# Patient Record
Sex: Female | Born: 1955
Health system: Southern US, Community
[De-identification: ages and names within clinical notes are randomized; demographics above are authoritative.]

## PROBLEM LIST (undated history)

## (undated) DIAGNOSIS — I10 Essential (primary) hypertension: Secondary | ICD-10-CM

## (undated) DIAGNOSIS — E559 Vitamin D deficiency, unspecified: Secondary | ICD-10-CM

## (undated) DIAGNOSIS — M8580 Other specified disorders of bone density and structure, unspecified site: Secondary | ICD-10-CM

## (undated) DIAGNOSIS — R42 Dizziness and giddiness: Secondary | ICD-10-CM

## (undated) DIAGNOSIS — E785 Hyperlipidemia, unspecified: Secondary | ICD-10-CM

## (undated) DIAGNOSIS — N809 Endometriosis, unspecified: Secondary | ICD-10-CM

## (undated) HISTORY — PX: BREAST EXCISIONAL BIOPSY: SUR124

## (undated) HISTORY — DX: Other specified disorders of bone density and structure, unspecified site: M85.80

## (undated) HISTORY — DX: Vitamin D deficiency, unspecified: E55.9

## (undated) HISTORY — DX: Hyperlipidemia, unspecified: E78.5

## (undated) HISTORY — PX: DIAGNOSTIC LAPAROSCOPY: SUR761

## (undated) HISTORY — PX: VAGINAL HYSTERECTOMY: SUR661

## (undated) HISTORY — PX: TUBAL LIGATION: SHX77

## (undated) HISTORY — PX: BREAST BIOPSY: SHX20

## (undated) HISTORY — PX: COLONOSCOPY: SHX174

## (undated) HISTORY — DX: Endometriosis, unspecified: N80.9

---

## 2004-01-13 ENCOUNTER — Ambulatory Visit: Payer: Self-pay

## 2005-02-09 ENCOUNTER — Ambulatory Visit: Payer: Self-pay

## 2006-02-16 ENCOUNTER — Ambulatory Visit: Payer: Self-pay

## 2006-02-21 ENCOUNTER — Ambulatory Visit: Payer: Self-pay

## 2006-08-23 ENCOUNTER — Ambulatory Visit: Payer: Self-pay

## 2007-03-14 ENCOUNTER — Ambulatory Visit: Payer: Self-pay

## 2008-04-09 ENCOUNTER — Ambulatory Visit: Payer: Self-pay

## 2009-04-30 ENCOUNTER — Ambulatory Visit: Payer: Self-pay

## 2009-12-10 ENCOUNTER — Ambulatory Visit: Payer: Self-pay | Admitting: Unknown Physician Specialty

## 2010-06-02 ENCOUNTER — Ambulatory Visit: Payer: Self-pay

## 2011-06-07 ENCOUNTER — Ambulatory Visit: Payer: Self-pay

## 2012-07-24 ENCOUNTER — Ambulatory Visit: Payer: Self-pay

## 2013-07-19 LAB — HM PAP SMEAR: HM Pap smear: NEGATIVE

## 2013-10-02 ENCOUNTER — Ambulatory Visit: Payer: Self-pay

## 2014-11-05 LAB — HM MAMMOGRAPHY

## 2015-01-25 HISTORY — PX: COLONOSCOPY: SHX174

## 2015-11-11 ENCOUNTER — Other Ambulatory Visit: Payer: Self-pay | Admitting: Obstetrics and Gynecology

## 2016-01-13 ENCOUNTER — Other Ambulatory Visit: Payer: Self-pay | Admitting: Obstetrics and Gynecology

## 2016-01-13 DIAGNOSIS — Z1231 Encounter for screening mammogram for malignant neoplasm of breast: Secondary | ICD-10-CM

## 2016-02-17 ENCOUNTER — Ambulatory Visit
Admission: RE | Admit: 2016-02-17 | Discharge: 2016-02-17 | Disposition: A | Discharge status: Home / Self Care | Payer: 59 | Source: Ambulatory Visit | Attending: Obstetrics and Gynecology | Admitting: Obstetrics and Gynecology

## 2016-02-17 ENCOUNTER — Encounter: Payer: Self-pay | Admitting: Radiology

## 2016-02-17 DIAGNOSIS — Z1231 Encounter for screening mammogram for malignant neoplasm of breast: Secondary | ICD-10-CM | POA: Insufficient documentation

## 2016-02-23 ENCOUNTER — Other Ambulatory Visit: Payer: Self-pay | Admitting: Obstetrics and Gynecology

## 2016-02-23 DIAGNOSIS — R928 Other abnormal and inconclusive findings on diagnostic imaging of breast: Secondary | ICD-10-CM

## 2016-02-23 DIAGNOSIS — N6489 Other specified disorders of breast: Secondary | ICD-10-CM

## 2016-02-25 ENCOUNTER — Ambulatory Visit
Admission: RE | Admit: 2016-02-25 | Discharge: 2016-02-25 | Disposition: A | Discharge status: Home / Self Care | Payer: 59 | Source: Ambulatory Visit | Attending: Obstetrics and Gynecology | Admitting: Obstetrics and Gynecology

## 2016-02-25 DIAGNOSIS — R928 Other abnormal and inconclusive findings on diagnostic imaging of breast: Secondary | ICD-10-CM

## 2016-02-25 DIAGNOSIS — N6489 Other specified disorders of breast: Secondary | ICD-10-CM

## 2016-03-07 ENCOUNTER — Other Ambulatory Visit: Payer: 59

## 2016-03-07 ENCOUNTER — Ambulatory Visit: Payer: 59

## 2017-02-02 ENCOUNTER — Ambulatory Visit (INDEPENDENT_AMBULATORY_CARE_PROVIDER_SITE_OTHER): Payer: 59 | Admitting: Obstetrics and Gynecology

## 2017-02-02 ENCOUNTER — Encounter: Payer: Self-pay | Admitting: Obstetrics and Gynecology

## 2017-02-02 VITALS — BP 120/80 | HR 69 | Ht 64.0 in | Wt 160.0 lb

## 2017-02-02 DIAGNOSIS — Z1231 Encounter for screening mammogram for malignant neoplasm of breast: Secondary | ICD-10-CM | POA: Diagnosis not present

## 2017-02-02 DIAGNOSIS — Z01419 Encounter for gynecological examination (general) (routine) without abnormal findings: Secondary | ICD-10-CM | POA: Diagnosis not present

## 2017-02-02 DIAGNOSIS — Z1239 Encounter for other screening for malignant neoplasm of breast: Secondary | ICD-10-CM

## 2017-02-02 DIAGNOSIS — Z1151 Encounter for screening for human papillomavirus (HPV): Secondary | ICD-10-CM | POA: Diagnosis not present

## 2017-02-02 DIAGNOSIS — Z124 Encounter for screening for malignant neoplasm of cervix: Secondary | ICD-10-CM | POA: Diagnosis not present

## 2017-02-02 NOTE — Progress Notes (Signed)
PCP: Colleen Stephenson, No Pcp Per   Chief Complaint  Colleen Stephenson presents with  . Gynecologic Exam    HPI:      [name] is a 62 y.o. [id number] who LMP was No LMP recorded. Colleen Stephenson has had a hysterectomy., presents today for her annual examination.  Her menses are absent due to menopause. She does not have intermenstrual bleeding.  She does not have vasomotor sx.  Sex activity: single partner, contraception - post menopausal status. She does not have vaginal dryness.  Last Pap: July 18, 2013  Results were: no abnormalities /neg HPV DNA.  Hx of STDs: none  Last mammogram: February 25, 2016  Results were: normal--routine follow-up in 12 months There is no FH of breast cancer. There is no FH of ovarian cancer. The Colleen Stephenson does do self-breast exams.  Colonoscopy: colonoscopy 2 years ago with abnormalities. . Repeat due after 5 years.   Tobacco use: The Colleen Stephenson denies current or previous tobacco use. Alcohol use: social drinker Exercise: moderately active  She does get adequate calcium and Vitamin D in her diet.  Declines fasting labs--"Not going to make any changes anyway"  Past Medical History:  Diagnosis Date  . Endometriosis   . Hyperlipidemia   . Osteopenia   . Vitamin D deficiency     Past Surgical History:  Procedure Laterality Date  . BREAST BIOPSY Right    excisional late 1990's  . COLONOSCOPY    . DIAGNOSTIC LAPAROSCOPY    . TUBAL LIGATION    . VAGINAL HYSTERECTOMY      Family History  Problem Relation Age of Onset  . Stomach cancer Father   . Breast cancer Neg Hx   . Bone cancer Neg Hx   . Hypertension Neg Hx     Social History   Socioeconomic History  . Marital status: Single    Spouse name: Not on file  . Number of children: Not on file  . Years of education: Not on file  . Highest education level: Not on file  Social Needs  . Financial resource strain: Not on file  . Food insecurity - worry: Not on file  . Food insecurity - inability: Not on  file  . Transportation needs - medical: Not on file  . Transportation needs - non-medical: Not on file  Occupational History  . Not on file  Tobacco Use  . Smoking status: Never Smoker  . Smokeless tobacco: Never Used  Substance and Sexual Activity  . Alcohol use: Yes  . Drug use: No  . Sexual activity: Yes  Other Topics Concern  . Not on file  Social History Narrative  . Not on file    No outpatient medications have been marked as taking for the 02/02/17 encounter (Office Visit) with Alanmichael Barmore, Deirdre Evener, PA-C.      ROS:  Review of Systems  Constitutional: Negative for fatigue, fever and unexpected weight change.  Respiratory: Negative for cough, shortness of breath and wheezing.   Cardiovascular: Negative for chest pain, palpitations and leg swelling.  Gastrointestinal: Negative for blood in stool, constipation, diarrhea, nausea and vomiting.  Endocrine: Negative for cold intolerance, heat intolerance and polyuria.  Genitourinary: Negative for dyspareunia, dysuria, flank pain, frequency, genital sores, hematuria, menstrual problem, pelvic pain, urgency, vaginal bleeding, vaginal discharge and vaginal pain.  Musculoskeletal: Negative for back pain, joint swelling and myalgias.  Skin: Negative for rash.  Neurological: Negative for dizziness, syncope, light-headedness, numbness and headaches.  Hematological: Negative for adenopathy.  Psychiatric/Behavioral:  Negative for agitation, confusion, sleep disturbance and suicidal ideas. The Colleen Stephenson is not nervous/anxious.      Objective: BP 120/80   Pulse 69   Ht 5\' 4"  (1.626 m)   Wt 160 lb (72.6 kg)   BMI 27.46 kg/m    Physical Exam  Constitutional: She is oriented to person, place, and time. She appears well-developed and well-nourished.  Genitourinary: Vagina normal and uterus normal. There is no rash or tenderness on the right labia. There is no rash or tenderness on the left labia. No erythema or tenderness in the vagina.  No vaginal discharge found. Right adnexum does not display mass and does not display tenderness. Left adnexum does not display mass and does not display tenderness. Cervix does not exhibit motion tenderness or polyp. Uterus is not enlarged or tender.  Neck: Normal range of motion. No thyromegaly present.  Cardiovascular: Normal rate, regular rhythm and normal heart sounds.  No murmur heard. Pulmonary/Chest: Effort normal and breath sounds normal. Right breast exhibits no mass, no nipple discharge, no skin change and no tenderness. Left breast exhibits no mass, no nipple discharge, no skin change and no tenderness.  Abdominal: Soft. There is no tenderness. There is no guarding.  Musculoskeletal: Normal range of motion.  Neurological: She is alert and oriented to person, place, and time. No cranial nerve deficit.  Psychiatric: She has a normal mood and affect. Her behavior is normal.  Vitals reviewed.   Assessment/Plan:  Encounter for annual routine gynecological examination  Cervical cancer screening - Plan: IGP, Aptima HPV  Screening for HPV (human papillomavirus) - Plan: IGP, Aptima HPV  Screening for breast cancer - Pt to sched mammo. - Plan: MM DIGITAL SCREENING BILATERAL         GYN counsel mammography screening, adequate intake of calcium and vitamin D, diet and exercise    F/U  Return in about 1 year (around 02/02/2018).  Ercia Crisafulli B. Ernest Orr, PA-C 02/02/2017 8:28 AM

## 2017-02-02 NOTE — Patient Instructions (Signed)
I value your feedback and entrusting us with your care. If you get a [facility] patient survey, I would appreciate you taking the time to let us know about your experience today. Thank you! 

## 2017-02-04 LAB — IGP, APTIMA HPV
HPV Aptima: NEGATIVE
PAP SMEAR COMMENT: 0

## 2017-03-15 ENCOUNTER — Encounter: Payer: Self-pay | Admitting: Obstetrics and Gynecology

## 2017-03-15 ENCOUNTER — Ambulatory Visit
Admission: RE | Admit: 2017-03-15 | Discharge: 2017-03-15 | Disposition: A | Discharge status: Home / Self Care | Payer: 59 | Source: Ambulatory Visit | Attending: Obstetrics and Gynecology | Admitting: Obstetrics and Gynecology

## 2017-03-15 DIAGNOSIS — Z1231 Encounter for screening mammogram for malignant neoplasm of breast: Secondary | ICD-10-CM | POA: Diagnosis present

## 2017-03-15 DIAGNOSIS — Z1239 Encounter for other screening for malignant neoplasm of breast: Secondary | ICD-10-CM

## 2018-01-28 IMAGING — MG MM DIGITAL DIAGNOSTIC UNILAT*R* W/ TOMO W/ CAD
6 series · 6 of 14 positions shown · non-contrast
Comparison: Previous exam(s).

CLINICAL DATA: Callback from screening mammogram for possible
asymmetry

EXAM:
2D DIGITAL DIAGNOSTIC UNILATERAL RIGHT MAMMOGRAM WITH CAD AND
ADJUNCT TOMO

[R MLO]
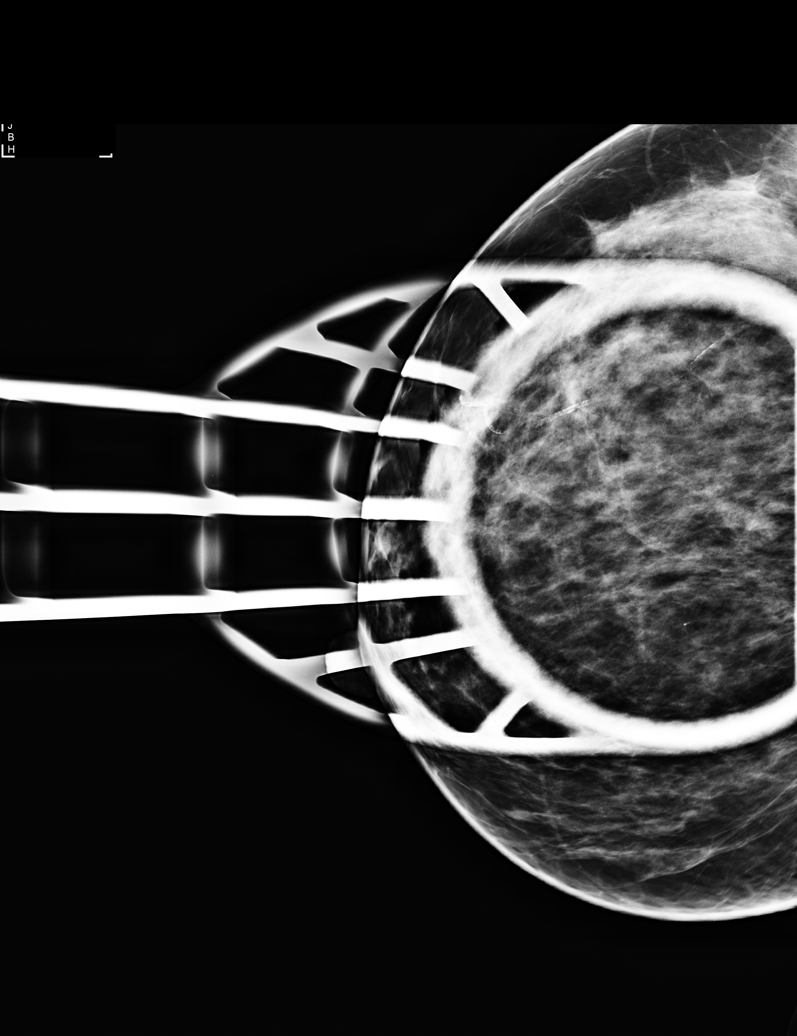

[R CC synth-2D]
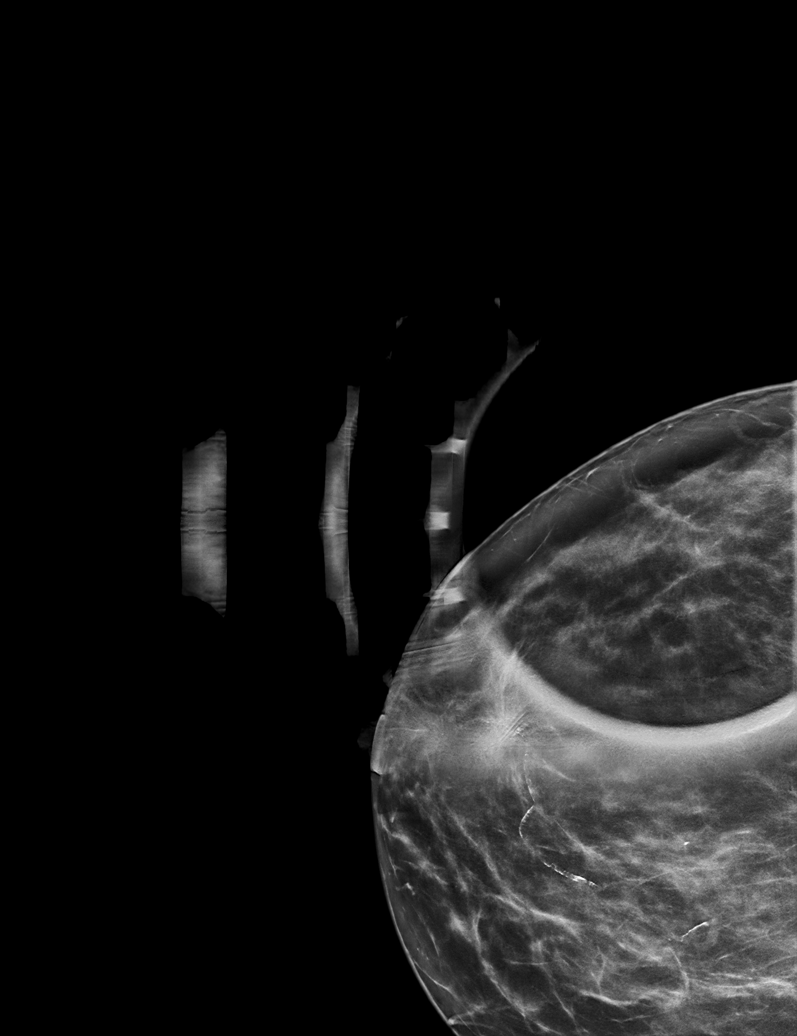

[R MLO synth-2D]
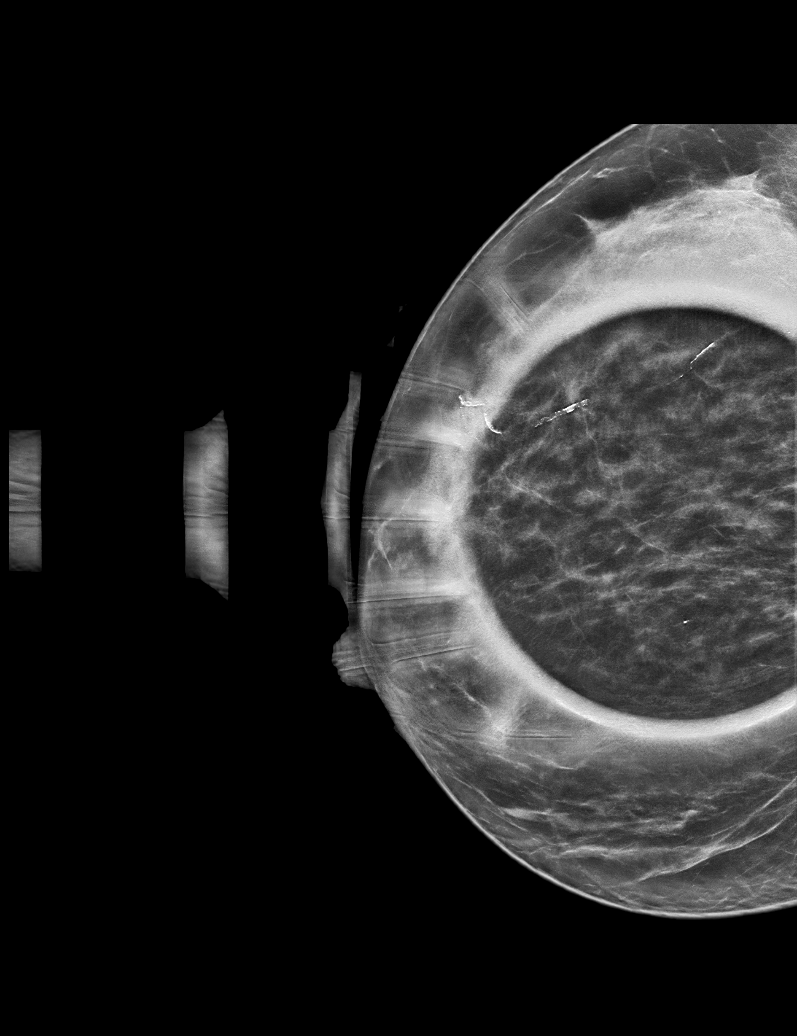

[R CC]
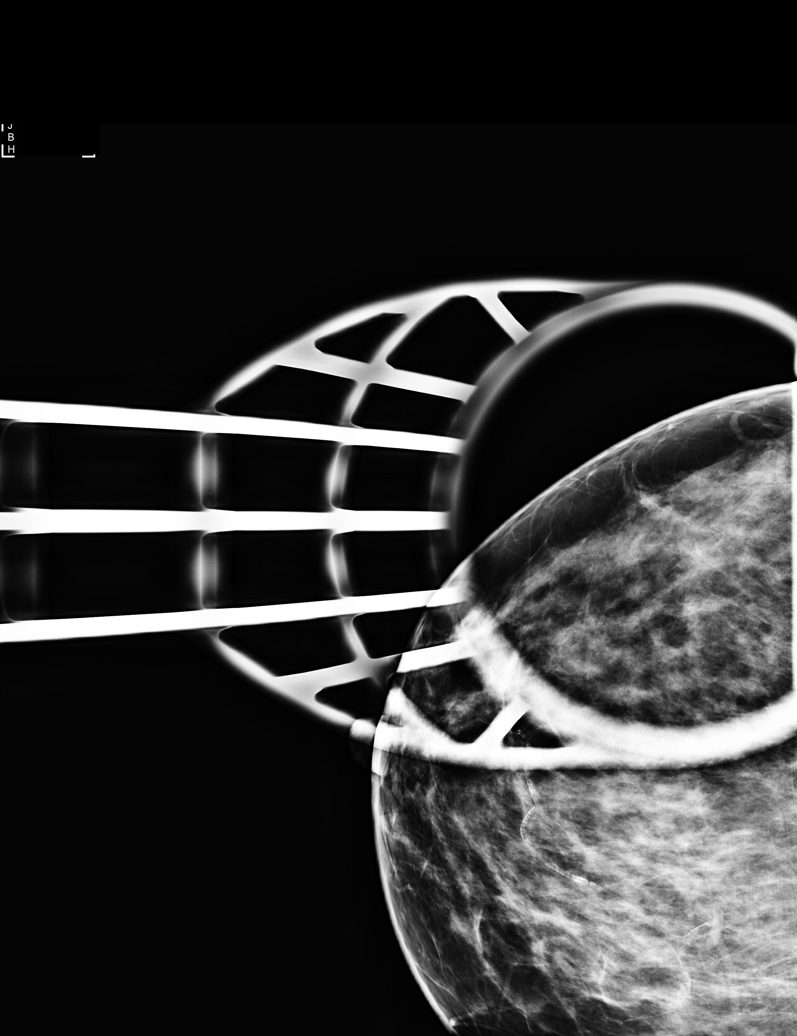

[R CC tomo · tomo slice 35/68.0]
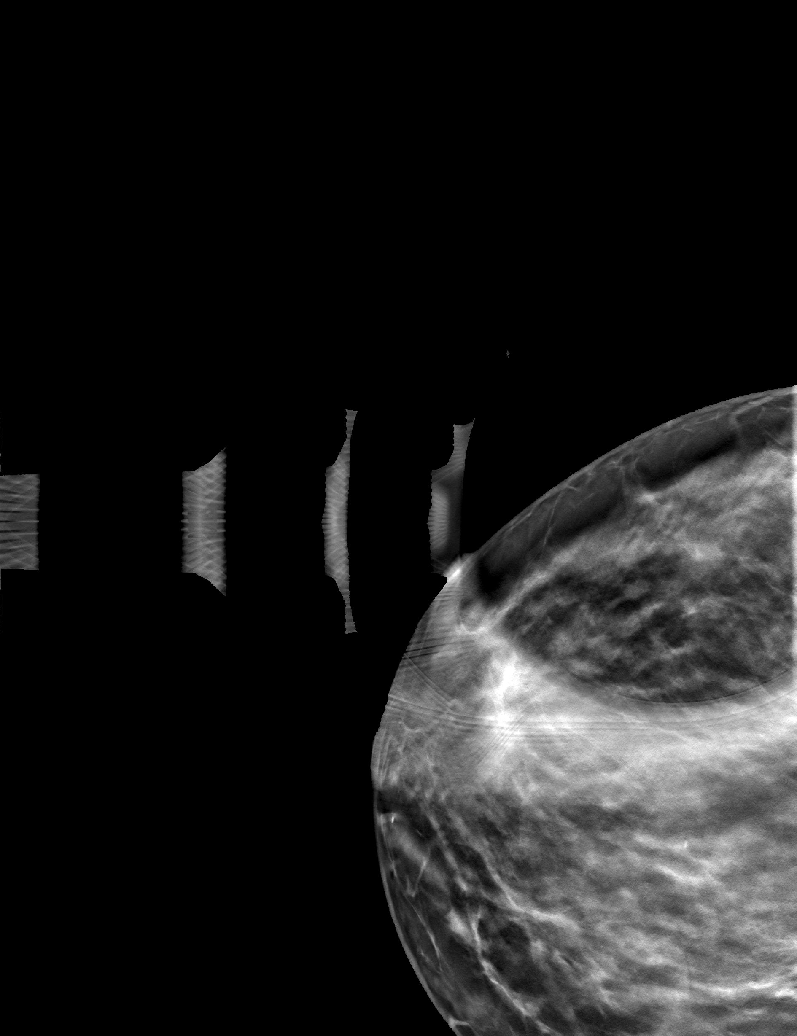

[R MLO tomo · tomo slice 34/67.0]
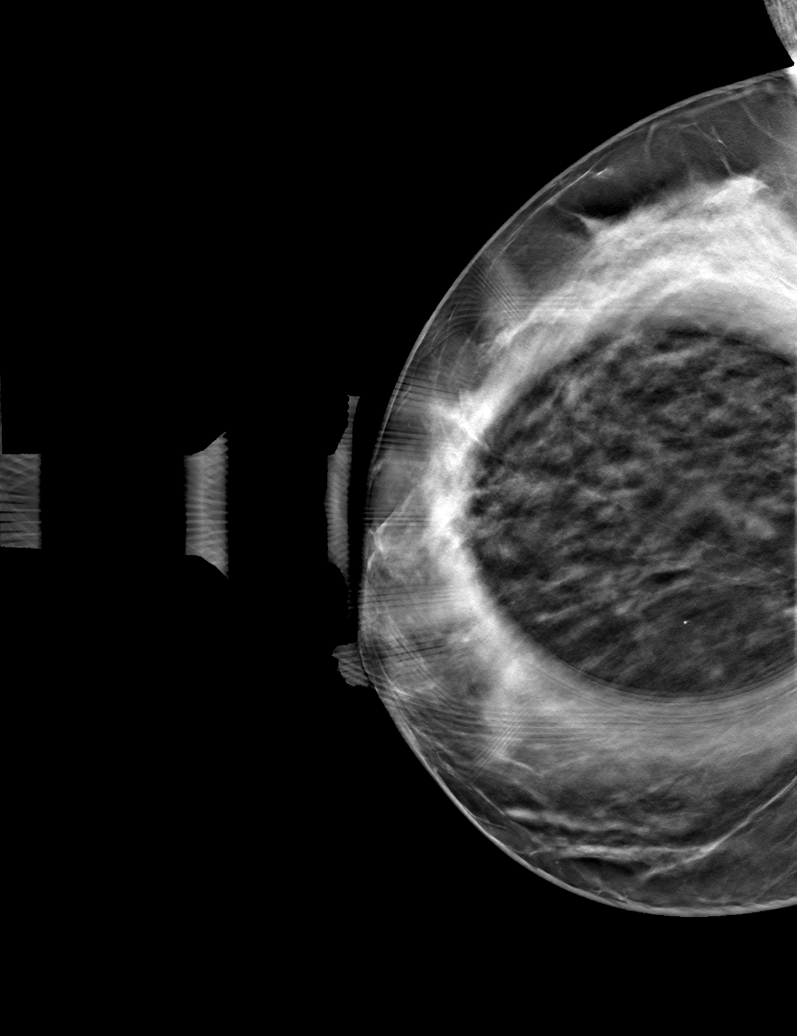

[6 of 14 positions shown; findings below may reference images not displayed]

ACR Breast Density Category c: The breast tissue is heterogeneously
dense, which may obscure small masses.
FINDINGS: Cc and MLO spot-compression views of the area of concern in the
right breast were obtained with tomosynthesis. No persistent
abnormality is identified in the area of concern. The breast
parenchymal pattern appears similar to multiple prior mammograms
dating back to at least 5696.

Mammographic images were processed with CAD.
IMPRESSION: No mammographic evidence of malignancy.

RECOMMENDATION:
Screening mammogram in one year.(Code:[id number]-[id number])

I have discussed the findings and recommendations with the patient.
Results were also provided in writing at the conclusion of the
visit. If applicable, a reminder letter will be sent to the patient
regarding the next appointment.

BI-RADS CATEGORY  1: Negative.

## 2018-04-23 ENCOUNTER — Other Ambulatory Visit: Payer: Self-pay | Admitting: Obstetrics and Gynecology

## 2018-05-22 ENCOUNTER — Ambulatory Visit: Payer: 59 | Admitting: Obstetrics and Gynecology

## 2018-07-11 NOTE — Patient Instructions (Addendum)
I value your feedback and entrusting us with your care. If you get a [place] patient survey, I would appreciate you taking the time to let us know about your experience today. Thank you!  [facility]: [phone]    

## 2018-07-11 NOTE — Progress Notes (Addendum)
PCP: Patient, No Pcp Per   Chief Complaint  Patient presents with  . Gynecologic Exam    HPI:      [name] is a 63 y.o. [id number] who LMP was No LMP recorded. Patient has had a hysterectomy., presents today for her annual examination.  Her menses are absent due to menopause. She does not have intermenstrual bleeding. She does not have vasomotor sx.   Sex activity: single partner, contraception - post menopausal status. She does not have vaginal dryness.  Last Pap: 02/02/17  Results were: no abnormalities /neg HPV DNA.  Hx of STDs: none  Last mammogram: 03/15/17  Results were: normal--routine follow-up in 12 months There is a FH of breast cancer in her pat cousin, genetic testing not indicated for pt. There is no FH of ovarian cancer. The patient does do occas self-breast exams.  Colonoscopy: colonoscopy 3 years ago with abnormalities. . Repeat due after 5 years.   Tobacco use: The patient denies current or previous tobacco use. Alcohol use: social drinker Exercise: moderately active  She does get adequate calcium and Vitamin D in her diet.  Declines fasting labs.  Past Medical History:  Diagnosis Date  . Endometriosis   . Hyperlipidemia   . Osteopenia   . Vitamin D deficiency     Past Surgical History:  Procedure Laterality Date  . BREAST BIOPSY Right    excisional late 1990's  . COLONOSCOPY    . DIAGNOSTIC LAPAROSCOPY    . TUBAL LIGATION    . VAGINAL HYSTERECTOMY      Family History  Problem Relation Age of Onset  . Stomach cancer Father   . Breast cancer Cousin   . Bone cancer Neg Hx   . Hypertension Neg Hx     Social History   Socioeconomic History  . Marital status: Single    Spouse name: Not on file  . Number of children: Not on file  . Years of education: Not on file  . Highest education level: Not on file  Occupational History  . Not on file  Social Needs  . Financial resource strain: Not on file  . Food insecurity    Worry: Not  on file    Inability: Not on file  . Transportation needs    Medical: Not on file    Non-medical: Not on file  Tobacco Use  . Smoking status: Never Smoker  . Smokeless tobacco: Never Used  Substance and Sexual Activity  . Alcohol use: Yes  . Drug use: No  . Sexual activity: Yes    Birth control/protection: Surgical    Comment: Hysterectomy  Lifestyle  . Physical activity    Days per week: Not on file    Minutes per session: Not on file  . Stress: Not on file  Relationships  . Social Herbalist on phone: Not on file    Gets together: Not on file    Attends religious service: Not on file    Active member of club or organization: Not on file    Attends meetings of clubs or organizations: Not on file    Relationship status: Not on file  . Intimate partner violence    Fear of current or ex partner: Not on file    Emotionally abused: Not on file    Physically abused: Not on file    Forced sexual activity: Not on file  Other Topics Concern  . Not on file  Social History  Narrative  . Not on file    No outpatient medications have been marked as taking for the 07/12/18 encounter (Office Visit) with Calise Dunckel, Deirdre Evener, PA-C.      ROS:  Review of Systems  Constitutional: Negative for fatigue, fever and unexpected weight change.  Respiratory: Negative for cough, shortness of breath and wheezing.   Cardiovascular: Negative for chest pain, palpitations and leg swelling.  Gastrointestinal: Negative for blood in stool, constipation, diarrhea, nausea and vomiting.  Endocrine: Negative for cold intolerance, heat intolerance and polyuria.  Genitourinary: Negative for dyspareunia, dysuria, flank pain, frequency, genital sores, hematuria, menstrual problem, pelvic pain, urgency, vaginal bleeding, vaginal discharge and vaginal pain.  Musculoskeletal: Negative for back pain, joint swelling and myalgias.  Skin: Negative for rash.  Neurological: Negative for dizziness, syncope,  light-headedness, numbness and headaches.  Hematological: Negative for adenopathy.  Psychiatric/Behavioral: Negative for agitation, confusion, sleep disturbance and suicidal ideas. The patient is not nervous/anxious.      Objective: BP 112/82   Ht 5\' 4"  (1.626 m)   Wt 159 lb 9.6 oz (72.4 kg)   BMI 27.40 kg/m    Physical Exam Constitutional:      Appearance: She is well-developed.  Genitourinary:     Vulva, vagina, right adnexa and left adnexa normal.     No vaginal discharge, erythema or tenderness.     Cervix is absent.     Uterus is absent.     No right or left adnexal mass present.     Right adnexa not tender.     Left adnexa not tender.     Genitourinary Comments: UTERUS/CX SURG REM  Neck:     Musculoskeletal: Normal range of motion.     Thyroid: No thyromegaly.  Cardiovascular:     Rate and Rhythm: Normal rate and regular rhythm.     Heart sounds: Normal heart sounds. No murmur.  Pulmonary:     Effort: Pulmonary effort is normal.     Breath sounds: Normal breath sounds.  Chest:     Breasts:        Right: No mass, nipple discharge, skin change or tenderness.        Left: No mass, nipple discharge, skin change or tenderness.  Abdominal:     Palpations: Abdomen is soft.     Tenderness: There is no abdominal tenderness. There is no guarding.  Musculoskeletal: Normal range of motion.  Neurological:     General: No focal deficit present.     Mental Status: She is alert and oriented to person, place, and time.     Cranial Nerves: No cranial nerve deficit.  Skin:    General: Skin is warm and dry.  Psychiatric:        Mood and Affect: Mood normal.        Behavior: Behavior normal.        Thought Content: Thought content normal.        Judgment: Judgment normal.  Vitals signs reviewed.     Assessment/Plan:  Encounter for annual routine gynecological examination -   Screening for breast cancer - Plan: MM 3D SCREEN BREAST BILATERAL, Pt to sched mammo  Blood  tests for routine general physical examination - Pt declines labs. F/u prn         GYN counsel mammography screening, adequate intake of calcium and vitamin D, diet and exercise    F/U  Return in about 1 year (around 07/12/2019).  Kaidence Callaway B. Saprina Chuong, PA-C 07/12/2018 10:30 AM

## 2018-07-12 ENCOUNTER — Other Ambulatory Visit: Payer: Self-pay

## 2018-07-12 ENCOUNTER — Encounter: Payer: Self-pay | Admitting: Obstetrics and Gynecology

## 2018-07-12 ENCOUNTER — Ambulatory Visit (INDEPENDENT_AMBULATORY_CARE_PROVIDER_SITE_OTHER): Payer: BLUE CROSS/BLUE SHIELD | Admitting: Obstetrics and Gynecology

## 2018-07-12 VITALS — BP 112/82 | Ht 64.0 in | Wt 159.6 lb

## 2018-07-12 DIAGNOSIS — Z1239 Encounter for other screening for malignant neoplasm of breast: Secondary | ICD-10-CM

## 2018-07-12 DIAGNOSIS — Z01419 Encounter for gynecological examination (general) (routine) without abnormal findings: Secondary | ICD-10-CM | POA: Diagnosis not present

## 2018-07-12 DIAGNOSIS — Z0000 Encounter for general adult medical examination without abnormal findings: Secondary | ICD-10-CM

## 2018-08-22 ENCOUNTER — Ambulatory Visit
Admission: RE | Admit: 2018-08-22 | Discharge: 2018-08-22 | Disposition: A | Discharge status: Home / Self Care | Payer: BLUE CROSS/BLUE SHIELD | Source: Ambulatory Visit | Attending: Obstetrics and Gynecology | Admitting: Obstetrics and Gynecology

## 2018-08-22 ENCOUNTER — Encounter: Payer: Self-pay | Admitting: Obstetrics and Gynecology

## 2018-08-22 DIAGNOSIS — Z1239 Encounter for other screening for malignant neoplasm of breast: Secondary | ICD-10-CM

## 2018-08-22 DIAGNOSIS — Z1231 Encounter for screening mammogram for malignant neoplasm of breast: Secondary | ICD-10-CM | POA: Diagnosis not present

## 2019-08-05 NOTE — Patient Instructions (Addendum)
I value your feedback and entrusting us with your care. If you get a [place] patient survey, I would appreciate you taking the time to let us know about your experience today. Thank you! ° °As of January 03, 2019, your lab results will be released to your MyChart immediately, before I even have a chance to see them. Please give me time to review them and contact you if there are any abnormalities. Thank you for your patience.  ° °[facility]: [phone] ° ° ° °

## 2019-08-05 NOTE — Progress Notes (Signed)
PCP: Chad Cordial, PA-C   Chief Complaint  Patient presents with  . Gynecologic Exam    HPI:      [name] is a 64 y.o. [id number] who LMP was No LMP recorded. Patient has had a hysterectomy., presents today for her annual examination.  Her menses are absent due to menopause/TVH. She does not have intermenstrual bleeding. She has occas vasomotor sx.   Sex activity: single partner, contraception - post menopausal status. She does not have vaginal dryness.  Last Pap: 02/02/17  Results were: no abnormalities /neg HPV DNA.  Hx of STDs: none  Last mammogram: 08/22/18  Results were: normal--routine follow-up in 12 months There is a FH of breast cancer in her pat cousin, genetic testing not indicated for pt. There is no FH of ovarian cancer. The patient does do occas self-breast exams.  Colonoscopy: colonoscopy 2017 with Dr. Vira Agar with abnormalities.  Repeat due after 5 years.   Tobacco use: The patient denies current or previous tobacco use. Alcohol use: social drinker  No drug use Exercise: moderately active  She does get adequate calcium but not Vitamin D in her diet.  Declines fasting labs--going to do with new PCP this yr.  Past Medical History:  Diagnosis Date  . Endometriosis   . Hyperlipidemia   . Osteopenia   . Vitamin D deficiency     Past Surgical History:  Procedure Laterality Date  . BREAST BIOPSY Right    excisional late 1990's  . COLONOSCOPY  2017   with Dr. Vira Agar; repeat after 5 yrs  . DIAGNOSTIC LAPAROSCOPY    . TUBAL LIGATION    . VAGINAL HYSTERECTOMY      Family History  Problem Relation Age of Onset  . Stomach cancer Father   . Breast cancer Cousin        not sure of age, not close with relative  . Bone cancer Neg Hx   . Hypertension Neg Hx     Social History   Socioeconomic History  . Marital status: Single    Spouse name: Not on file  . Number of children: Not on file  . Years of education: Not on file  . Highest  education level: Not on file  Occupational History  . Not on file  Tobacco Use  . Smoking status: Never Smoker  . Smokeless tobacco: Never Used  Vaping Use  . Vaping Use: Never used  Substance and Sexual Activity  . Alcohol use: Yes  . Drug use: No  . Sexual activity: Yes    Birth control/protection: Surgical    Comment: Hysterectomy  Other Topics Concern  . Not on file  Social History Narrative  . Not on file   Social Determinants of Health   Financial Resource Strain:   . Difficulty of Paying Living Expenses:   Food Insecurity:   . Worried About Charity fundraiser in the Last Year:   . Arboriculturist in the Last Year:   Transportation Needs:   . Film/video editor (Medical):   Marland Kitchen Lack of Transportation (Non-Medical):   Physical Activity:   . Days of Exercise per Week:   . Minutes of Exercise per Session:   Stress:   . Feeling of Stress :   Social Connections:   . Frequency of Communication with Friends and Family:   . Frequency of Social Gatherings with Friends and Family:   . Attends Religious Services:   . Active Member of Clubs  or Organizations:   . Attends Archivist Meetings:   Marland Kitchen Marital Status:   Intimate Partner Violence:   . Fear of Current or Ex-Partner:   . Emotionally Abused:   Marland Kitchen Physically Abused:   . Sexually Abused:     No outpatient medications have been marked as taking for the 08/06/19 encounter (Office Visit) with Fines Kimberlin, Deirdre Evener, PA-C.      ROS:  Review of Systems  Constitutional: Negative for fatigue, fever and unexpected weight change.  Respiratory: Negative for cough, shortness of breath and wheezing.   Cardiovascular: Negative for chest pain, palpitations and leg swelling.  Gastrointestinal: Negative for blood in stool, constipation, diarrhea, nausea and vomiting.  Endocrine: Negative for cold intolerance, heat intolerance and polyuria.  Genitourinary: Negative for dyspareunia, dysuria, flank pain, frequency,  genital sores, hematuria, menstrual problem, pelvic pain, urgency, vaginal bleeding, vaginal discharge and vaginal pain.  Musculoskeletal: Negative for back pain, joint swelling and myalgias.  Skin: Negative for rash.  Neurological: Negative for dizziness, syncope, light-headedness, numbness and headaches.  Hematological: Negative for adenopathy.  Psychiatric/Behavioral: Negative for agitation, confusion, sleep disturbance and suicidal ideas. The patient is not nervous/anxious.      Objective: BP 120/70   Ht 5\' 4"  (1.626 m)   Wt 157 lb (71.2 kg)   BMI 26.95 kg/m    Physical Exam Constitutional:      Appearance: She is well-developed.  Genitourinary:     Vulva, vagina, right adnexa and left adnexa normal.     No vaginal discharge, erythema or tenderness.     Cervix is absent.     Uterus is absent.     No right or left adnexal mass present.     Right adnexa not tender.     Left adnexa not tender.     Genitourinary Comments: UTERUS/CX SURG REM  Neck:     Thyroid: No thyromegaly.  Cardiovascular:     Rate and Rhythm: Normal rate and regular rhythm.     Heart sounds: Normal heart sounds. No murmur heard.   Pulmonary:     Effort: Pulmonary effort is normal.     Breath sounds: Normal breath sounds.  Chest:     Breasts:        Right: No mass, nipple discharge, skin change or tenderness.        Left: No mass, nipple discharge, skin change or tenderness.  Abdominal:     Palpations: Abdomen is soft.     Tenderness: There is no abdominal tenderness. There is no guarding.  Musculoskeletal:        General: Normal range of motion.     Cervical back: Normal range of motion.  Neurological:     General: No focal deficit present.     Mental Status: She is alert and oriented to person, place, and time.     Cranial Nerves: No cranial nerve deficit.  Skin:    General: Skin is warm and dry.  Psychiatric:        Mood and Affect: Mood normal.        Behavior: Behavior normal.         Thought Content: Thought content normal.        Judgment: Judgment normal.  Vitals reviewed.     Assessment/Plan:  Encounter for annual routine gynecological examination -   Screening for breast cancer - Plan: MM 3D SCREEN BREAST BILATERAL, Pt to sched mammo  Blood tests for routine general physical examination - Pt will do with  new PCP.         GYN counsel mammography screening, adequate intake of calcium and vitamin D, diet and exercise    F/U  Return in about 1 year (around 08/05/2020).  Lisaann Atha B. Katrinna Travieso, PA-C 08/06/2019 10:52 AM

## 2019-08-06 ENCOUNTER — Ambulatory Visit (INDEPENDENT_AMBULATORY_CARE_PROVIDER_SITE_OTHER): Payer: 59 | Admitting: Obstetrics and Gynecology

## 2019-08-06 ENCOUNTER — Encounter: Payer: Self-pay | Admitting: Obstetrics and Gynecology

## 2019-08-06 ENCOUNTER — Other Ambulatory Visit: Payer: Self-pay

## 2019-08-06 VITALS — BP 120/70 | Ht 64.0 in | Wt 157.0 lb

## 2019-08-06 DIAGNOSIS — Z01419 Encounter for gynecological examination (general) (routine) without abnormal findings: Secondary | ICD-10-CM

## 2019-08-06 DIAGNOSIS — Z1231 Encounter for screening mammogram for malignant neoplasm of breast: Secondary | ICD-10-CM

## 2019-08-28 ENCOUNTER — Ambulatory Visit
Admission: RE | Admit: 2019-08-28 | Discharge: 2019-08-28 | Disposition: A | Discharge status: Home / Self Care | Payer: 59 | Source: Ambulatory Visit | Attending: Obstetrics and Gynecology | Admitting: Obstetrics and Gynecology

## 2019-08-28 ENCOUNTER — Other Ambulatory Visit: Payer: Self-pay

## 2019-08-28 DIAGNOSIS — Z1231 Encounter for screening mammogram for malignant neoplasm of breast: Secondary | ICD-10-CM | POA: Insufficient documentation

## 2019-08-29 ENCOUNTER — Encounter: Payer: Self-pay | Admitting: Obstetrics and Gynecology

## 2019-09-10 ENCOUNTER — Other Ambulatory Visit: Payer: Self-pay | Admitting: Obstetrics and Gynecology

## 2019-09-10 ENCOUNTER — Telehealth: Payer: Self-pay

## 2019-09-10 NOTE — Telephone Encounter (Signed)
Pt called triage line stating she had to get a root canal and was on an abx x few days. She is now having vaginal swelling, not really itching. She picked up Monistat 3, but wanted to check with [facility] to see if she would need to be seen first or if something could be called in to [facility].

## 2019-09-10 NOTE — Progress Notes (Signed)
Rx diflucan for yeast vag sx after abx use

## 2019-09-10 NOTE — Telephone Encounter (Signed)
Please advise 

## 2019-09-10 NOTE — Telephone Encounter (Signed)
Rx diflucan eRxd. Sx c/w yeast vag, including swelling. Can do monistat, too. F/u prn

## 2019-09-10 NOTE — Telephone Encounter (Signed)
Pt aware.

## 2019-09-11 ENCOUNTER — Other Ambulatory Visit: Payer: Self-pay | Admitting: Obstetrics and Gynecology

## 2019-09-11 MED ORDER — FLUCONAZOLE 150 MG PO TABS: 150.0000 mg | ORAL_TABLET | Freq: Once | ORAL | 0 refills | Status: AC

## 2019-09-11 NOTE — Progress Notes (Signed)
Rx diflucan for yeast vag sx.  

## 2019-09-11 NOTE — Telephone Encounter (Signed)
Rx eRxd again. Pls notify pt

## 2019-09-11 NOTE — Telephone Encounter (Signed)
This encounter was created in error - please disregard.

## 2019-09-11 NOTE — Telephone Encounter (Signed)
Pt aware. Advised to call pharmacy before leaving for pick up to make sure they have it.

## 2019-09-11 NOTE — Telephone Encounter (Signed)
Patient went to [facility], was advised they have not received the rx. [id number]#[phone]

## 2019-09-23 ENCOUNTER — Ambulatory Visit (INDEPENDENT_AMBULATORY_CARE_PROVIDER_SITE_OTHER): Payer: 59 | Admitting: Family

## 2019-09-23 ENCOUNTER — Encounter: Payer: Self-pay | Admitting: Family

## 2019-09-23 ENCOUNTER — Other Ambulatory Visit: Payer: Self-pay

## 2019-09-23 VITALS — BP 132/74 | HR 84 | Temp 98.1°F | Ht 62.95 in | Wt 155.6 lb

## 2019-09-23 DIAGNOSIS — G4700 Insomnia, unspecified: Secondary | ICD-10-CM

## 2019-09-23 DIAGNOSIS — R030 Elevated blood-pressure reading, without diagnosis of hypertension: Secondary | ICD-10-CM | POA: Diagnosis not present

## 2019-09-23 DIAGNOSIS — Z0000 Encounter for general adult medical examination without abnormal findings: Secondary | ICD-10-CM | POA: Insufficient documentation

## 2019-09-23 DIAGNOSIS — I10 Essential (primary) hypertension: Secondary | ICD-10-CM | POA: Insufficient documentation

## 2019-09-23 NOTE — Progress Notes (Signed)
Subjective:    Patient ID: Colleen Stephenson, female    DOB: 1955/02/20, 64 y.o.   MRN: [id number]  CC: Colleen Stephenson is a 64 y.o. female who presents today to establish care.    HPI: Feels well today No new complaints or concerns  Insomnia- trouble with staying and falling asleep for years. Taking melatonin with relief.   Taking vitamin D 1000units.   Would like fasting labs.   Hike and bikes 5 days a week. No cp, sob.   No h/o htn           HISTORY:  Past Medical History:  Diagnosis Date  . Endometriosis   . Hyperlipidemia   . Osteopenia   . Vitamin D deficiency    Past Surgical History:  Procedure Laterality Date  . BREAST BIOPSY Right    excisional late 1990's  . BREAST EXCISIONAL BIOPSY    . COLONOSCOPY  2017   with Dr. Vira Agar; repeat after 5 yrs  . DIAGNOSTIC LAPAROSCOPY    . TUBAL LIGATION    . VAGINAL HYSTERECTOMY     Family History  Problem Relation Age of Onset  . Stomach cancer Father   . Breast cancer Cousin        not sure of age, not close with relative  . Bone cancer Neg Hx   . Hypertension Neg Hx     Allergies: Augmentin [amoxicillin-pot clavulanate] No current outpatient medications on file prior to visit.   No current facility-administered medications on file prior to visit.    Social History   Tobacco Use  . Smoking status: Never Smoker  . Smokeless tobacco: Never Used  Vaping Use  . Vaping Use: Never used  Substance Use Topics  . Alcohol use: Yes  . Drug use: No    Review of Systems  Constitutional: Negative for chills and fever.  Respiratory: Negative for cough.   Cardiovascular: Negative for chest pain and palpitations.  Gastrointestinal: Negative for nausea and vomiting.      Objective:    BP 132/74   Pulse 84   Temp 98.1 F (36.7 C)   Ht 5' 2.95" (1.599 m)   Wt 155 lb 9.6 oz (70.6 kg)   SpO2 98%   BMI 27.61 kg/m  BP Readings from Last 3 Encounters:  09/23/19 132/74  08/06/19 120/70  07/12/18  112/82   Wt Readings from Last 3 Encounters:  09/23/19 155 lb 9.6 oz (70.6 kg)  08/06/19 157 lb (71.2 kg)  07/12/18 159 lb 9.6 oz (72.4 kg)    Physical Exam Vitals reviewed.  Constitutional:      Appearance: She is well-developed.  Eyes:     Conjunctiva/sclera: Conjunctivae normal.  Cardiovascular:     Rate and Rhythm: Normal rate and regular rhythm.     Pulses: Normal pulses.     Heart sounds: Normal heart sounds.  Pulmonary:     Effort: Pulmonary effort is normal.     Breath sounds: Normal breath sounds. No wheezing, rhonchi or rales.  Skin:    General: Skin is warm and dry.  Neurological:     Mental Status: She is alert.  Psychiatric:        Speech: Speech normal.        Behavior: Behavior normal.        Thought Content: Thought content normal.        Assessment & Plan:   Problem List Items Addressed This Visit      Other  Elevated blood pressure reading    Elevated which appears atypical for patient. Advised of goal < 130/80 and to spot check blood pressure and call with readings.       Encounter for medical examination to establish care - Primary    Medical history reviewed. She will follow with GYN for annual. Fasting labs ordered.       Relevant Orders   CBC with Differential/Platelet   Comprehensive metabolic panel   Hemoglobin A1c   Lipid panel   TSH   VITAMIN D 25 Hydroxy (Vit-D Deficiency, Fractures)   Insomnia    Stable, controlled with melatonin.        Note: declines Tdap today.  Barnabas Lister Zobel does not currently have medications on file.   No orders of the defined types were placed in this encounter.   Return precautions given.   Risks, benefits, and alternatives of the medications and treatment plan prescribed today were discussed, and patient expressed understanding.   Education regarding symptom management and diagnosis given to patient on AVS.  Continue to follow with Burnard Hawthorne, FNP for routine health maintenance.    [name] and I agreed with plan.   Mable Paris, FNP

## 2019-09-23 NOTE — Assessment & Plan Note (Signed)
Stable, controlled with melatonin.

## 2019-09-23 NOTE — Assessment & Plan Note (Signed)
Medical history reviewed. She will follow with GYN for annual. Fasting labs ordered.

## 2019-09-23 NOTE — Patient Instructions (Addendum)
Nice to meet you!  Monitor blood pressure at home and me 5-6 reading on separate days. Goal is less than 120/80, based on newest guidelines, however we certainly want to be less than 130/80;  if persistently higher, please make sooner follow up appointment so we can recheck you blood pressure and manage/ adjust medications.

## 2019-09-23 NOTE — Assessment & Plan Note (Signed)
Elevated which appears atypical for patient. Advised of goal < 130/80 and to spot check blood pressure and call with readings.

## 2019-10-04 ENCOUNTER — Other Ambulatory Visit: Payer: 59

## 2019-10-08 ENCOUNTER — Other Ambulatory Visit (INDEPENDENT_AMBULATORY_CARE_PROVIDER_SITE_OTHER): Payer: 59

## 2019-10-08 ENCOUNTER — Other Ambulatory Visit: Payer: Self-pay

## 2019-10-08 DIAGNOSIS — Z0000 Encounter for general adult medical examination without abnormal findings: Secondary | ICD-10-CM

## 2019-10-08 LAB — LIPID PANEL
Cholesterol: 244 mg/dL — ABNORMAL HIGH (ref 0–200)
HDL: 62.2 mg/dL (ref >39.00)
LDL Cholesterol: 158 mg/dL — ABNORMAL HIGH (ref 0–99)
NonHDL: 182.06
Total CHOL/HDL Ratio: 4
Triglycerides: 118 mg/dL (ref 0.0–149.0)
VLDL: 23.6 mg/dL (ref 0.0–40.0)

## 2019-10-08 LAB — CBC WITH DIFFERENTIAL/PLATELET
Basophils Absolute: 0 10*3/uL (ref 0.0–0.1)
Basophils Relative: 0.9 % (ref 0.0–3.0)
Eosinophils Absolute: 0.2 10*3/uL (ref 0.0–0.7)
Eosinophils Relative: 5.5 % — ABNORMAL HIGH (ref 0.0–5.0)
HCT: 39.5 % (ref 36.0–46.0)
Hemoglobin: 13.3 g/dL (ref 12.0–15.0)
Lymphocytes Relative: 30.9 % (ref 12.0–46.0)
Lymphs Abs: 1.3 10*3/uL (ref 0.7–4.0)
MCHC: 33.6 g/dL (ref 30.0–36.0)
MCV: 92.9 fl (ref 78.0–100.0)
Monocytes Absolute: 0.4 10*3/uL (ref 0.1–1.0)
Monocytes Relative: 8.7 % (ref 3.0–12.0)
Neutro Abs: 2.3 10*3/uL (ref 1.4–7.7)
Neutrophils Relative %: 54 % (ref 43.0–77.0)
Platelets: 225 10*3/uL (ref 150.0–400.0)
RBC: 4.25 Mil/uL (ref 3.87–5.11)
RDW: 12.5 % (ref 11.5–15.5)
WBC: 4.3 10*3/uL (ref 4.0–10.5)

## 2019-10-08 LAB — COMPREHENSIVE METABOLIC PANEL
ALT: 12 U/L (ref 0–35)
AST: 16 U/L (ref 0–37)
Albumin: 4.4 g/dL (ref 3.5–5.2)
Alkaline Phosphatase: 70 U/L (ref 39–117)
BUN: 17 mg/dL (ref 6–23)
CO2: 30 mEq/L (ref 19–32)
Calcium: 9.7 mg/dL (ref 8.4–10.5)
Chloride: 101 mEq/L (ref 96–112)
Creatinine, Ser: 0.97 mg/dL (ref 0.40–1.20)
GFR: 57.78 mL/min — ABNORMAL LOW (ref >60.00)
Glucose, Bld: 94 mg/dL (ref 70–99)
Potassium: 4.2 mEq/L (ref 3.5–5.1)
Sodium: 139 mEq/L (ref 135–145)
Total Bilirubin: 0.8 mg/dL (ref 0.2–1.2)
Total Protein: 7.1 g/dL (ref 6.0–8.3)

## 2019-10-08 LAB — VITAMIN D 25 HYDROXY (VIT D DEFICIENCY, FRACTURES): VITD: 21.33 ng/mL — ABNORMAL LOW (ref 30.00–100.00)

## 2019-10-08 LAB — HEMOGLOBIN A1C: Hgb A1c MFr Bld: 5.8 % (ref 4.6–6.5)

## 2019-10-08 LAB — TSH: TSH: 8.74 u[IU]/mL — ABNORMAL HIGH (ref 0.35–4.50)

## 2019-10-11 ENCOUNTER — Telehealth: Payer: Self-pay

## 2019-10-11 ENCOUNTER — Other Ambulatory Visit: Payer: Self-pay | Admitting: Family

## 2019-10-11 DIAGNOSIS — R7989 Other specified abnormal findings of blood chemistry: Secondary | ICD-10-CM

## 2019-10-11 NOTE — Telephone Encounter (Signed)
LMTCB for lab results.  

## 2019-11-25 ENCOUNTER — Other Ambulatory Visit (INDEPENDENT_AMBULATORY_CARE_PROVIDER_SITE_OTHER): Payer: 59

## 2019-11-25 ENCOUNTER — Other Ambulatory Visit: Payer: Self-pay

## 2019-11-25 DIAGNOSIS — R7989 Other specified abnormal findings of blood chemistry: Secondary | ICD-10-CM

## 2019-11-25 LAB — CBC WITH DIFFERENTIAL/PLATELET
Basophils Absolute: 0 10*3/uL (ref 0.0–0.1)
Basophils Relative: 1 % (ref 0.0–3.0)
Eosinophils Absolute: 0.2 10*3/uL (ref 0.0–0.7)
Eosinophils Relative: 3.4 % (ref 0.0–5.0)
HCT: 41.2 % (ref 36.0–46.0)
Hemoglobin: 14 g/dL (ref 12.0–15.0)
Lymphocytes Relative: 31.2 % (ref 12.0–46.0)
Lymphs Abs: 1.5 10*3/uL (ref 0.7–4.0)
MCHC: 34.1 g/dL (ref 30.0–36.0)
MCV: 92.1 fl (ref 78.0–100.0)
Monocytes Absolute: 0.4 10*3/uL (ref 0.1–1.0)
Monocytes Relative: 9.3 % (ref 3.0–12.0)
Neutro Abs: 2.7 10*3/uL (ref 1.4–7.7)
Neutrophils Relative %: 55.1 % (ref 43.0–77.0)
Platelets: 231 10*3/uL (ref 150.0–400.0)
RBC: 4.47 Mil/uL (ref 3.87–5.11)
RDW: 13 % (ref 11.5–15.5)
WBC: 4.8 10*3/uL (ref 4.0–10.5)

## 2019-11-25 LAB — T3, FREE: T3, Free: 6.9 pg/mL — ABNORMAL HIGH (ref 2.3–4.2)

## 2019-11-25 LAB — TSH: TSH: 6.66 u[IU]/mL — ABNORMAL HIGH (ref 0.35–4.50)

## 2019-11-25 LAB — T4, FREE: Free T4: 0.57 ng/dL — ABNORMAL LOW (ref 0.60–1.60)

## 2019-11-27 ENCOUNTER — Other Ambulatory Visit: Payer: Self-pay | Admitting: Family

## 2019-11-27 DIAGNOSIS — R7989 Other specified abnormal findings of blood chemistry: Secondary | ICD-10-CM

## 2019-12-24 ENCOUNTER — Telehealth: Payer: Self-pay | Admitting: Family

## 2019-12-24 NOTE — Telephone Encounter (Signed)
Pt called and wanted her referral changed to [facility].

## 2019-12-24 NOTE — Telephone Encounter (Signed)
Pt referred to [place] Can we switch to [facility] endocrine?  Let me know and thank you!

## 2019-12-24 NOTE — Telephone Encounter (Signed)
Received and sent. YW!

## 2020-01-06 ENCOUNTER — Ambulatory Visit: Payer: 59 | Admitting: Endocrinology

## 2020-06-18 ENCOUNTER — Telehealth (INDEPENDENT_AMBULATORY_CARE_PROVIDER_SITE_OTHER): Payer: 59 | Admitting: Adult Health

## 2020-06-18 ENCOUNTER — Encounter: Payer: Self-pay | Admitting: Adult Health

## 2020-06-18 VITALS — Ht 62.95 in | Wt 150.0 lb

## 2020-06-18 DIAGNOSIS — F419 Anxiety disorder, unspecified: Secondary | ICD-10-CM

## 2020-06-18 DIAGNOSIS — G4700 Insomnia, unspecified: Secondary | ICD-10-CM

## 2020-06-18 MED ORDER — TRAZODONE HCL 50 MG PO TABS: 25.0000 mg | ORAL_TABLET | Freq: Every evening | ORAL | 0 refills | Status: DC | PRN

## 2020-06-18 NOTE — Progress Notes (Signed)
Virtual Visit via Telephone Note  I connected with Colleen Stephenson on 06/22/20 at  4:30 PM EDT by telephone and verified that I am speaking with the correct person using two identifiers. Parties involved in visit as below:   Location: Patient: at home  Provider:Provider: Provider's office at  [facility], [place].      I discussed the limitations, risks, security and privacy concerns of performing an evaluation and management service by telephone and the availability of in person appointments. I also discussed with the patient that there may be a patient responsible charge related to this service. The patient expressed understanding and agreed to proceed.   History of Present Illness: Patient is a 65 year old female in no acute distress who calls the office for telephone visit. She reports she is not sleeping- she has been anxious  Recent break up in 25 year relationship.  She has been anxious with this.  She has tried melatonin in past.  Denies ay suicidal/ thoughts or intent.  Patient  denies any fever, body aches,chills, rash, chest pain, shortness of breath, nausea, vomiting, or diarrhea.     Observations/Objective:   Patient is alert and oriented and responsive to questions Engages in conversation with provider. Speaks in full sentences without any pauses without any shortness of breath or distress.  Assessment and Plan:  Insomnia, unspecified type - Plan: traZODone (DESYREL) 50 MG tablet  Anxiety - Plan: traZODone (DESYREL) 50 MG tablet  Meds ordered this encounter  Medications  . traZODone (DESYREL) 50 MG tablet    Sig: Take 0.5-1 tablets (25-50 mg total) by mouth at bedtime as needed for sleep.    Dispense:  30 tablet    Refill:  0   Discussed medication and also disussed side effects.  Follow Up Instructions:  Advised in person evaluation at anytime is advised if any symptoms do not improve, worsen or change at any given time.  Red  Flags discussed. The patient was given clear instructions to go to ER or return to medical center if any red flags develop, symptoms do not improve, worsen or new problems develop. They verbalized understanding.   I discussed the assessment and treatment plan with the patient. The patient was provided an opportunity to ask questions and all were answered. The patient agreed with the plan and demonstrated an understanding of the instructions.   The patient was advised to call back or seek an in-person evaluation if the symptoms worsen or if the condition fails to improve as anticipated.  I provided 32  minutes of non-face-to-face time during this encounter.   Colleen Buffy, FNP

## 2020-06-22 ENCOUNTER — Encounter: Payer: Self-pay | Admitting: Adult Health

## 2020-07-10 ENCOUNTER — Other Ambulatory Visit: Payer: Self-pay | Admitting: Adult Health

## 2020-07-10 DIAGNOSIS — G4700 Insomnia, unspecified: Secondary | ICD-10-CM

## 2020-07-10 DIAGNOSIS — F419 Anxiety disorder, unspecified: Secondary | ICD-10-CM

## 2020-07-21 ENCOUNTER — Other Ambulatory Visit: Payer: Self-pay | Admitting: Adult Health

## 2020-07-21 DIAGNOSIS — F419 Anxiety disorder, unspecified: Secondary | ICD-10-CM

## 2020-07-21 DIAGNOSIS — G4700 Insomnia, unspecified: Secondary | ICD-10-CM

## 2020-07-21 MED ORDER — TRAZODONE HCL 50 MG PO TABS: 25.0000 mg | ORAL_TABLET | Freq: Every evening | ORAL | 0 refills | Status: DC | PRN

## 2020-08-03 DIAGNOSIS — Z86018 Personal history of other benign neoplasm: Secondary | ICD-10-CM | POA: Diagnosis not present

## 2020-08-03 DIAGNOSIS — L570 Actinic keratosis: Secondary | ICD-10-CM | POA: Diagnosis not present

## 2020-08-03 DIAGNOSIS — L821 Other seborrheic keratosis: Secondary | ICD-10-CM | POA: Diagnosis not present

## 2020-08-03 DIAGNOSIS — L578 Other skin changes due to chronic exposure to nonionizing radiation: Secondary | ICD-10-CM | POA: Diagnosis not present

## 2020-09-02 DIAGNOSIS — E063 Autoimmune thyroiditis: Secondary | ICD-10-CM | POA: Diagnosis not present

## 2020-09-02 DIAGNOSIS — E038 Other specified hypothyroidism: Secondary | ICD-10-CM | POA: Diagnosis not present

## 2020-09-02 DIAGNOSIS — E039 Hypothyroidism, unspecified: Secondary | ICD-10-CM | POA: Diagnosis not present

## 2020-09-15 NOTE — Progress Notes (Signed)
PCP: Burnard Hawthorne, FNP   Chief Complaint  Patient presents with   Gynecologic Exam    No concerns    HPI:      [name] is a 65 y.o. [phone] who LMP was No LMP recorded. Patient has had a hysterectomy., presents today for her MEDICARE annual examination.  Her menses are absent due to menopause/TVH. She does not have PMB. She denies vasomotor sx.   Sex activity: single partner, contraception - post menopausal status. She does not have vaginal dryness.  Last Pap: 02/02/17 Results were: no abnormalities /neg HPV DNA.  Hx of STDs: none  Last mammogram: 08/28/19 Results were: normal--routine follow-up in 12 months There is a FH of breast cancer in her brother (new dx 12/21) and pat cousin. Her brother is BRCA/panel negative. There is no FH of ovarian cancer. The patient does do occas self-breast exams.  Colonoscopy: colonoscopy 2017 with Dr. Vira Agar with abnormalities.  Repeat due after 5 years. Pt will have order placed by PCP.   Tobacco use: The patient denies current or previous tobacco use. Alcohol use: social drinker  No drug use Exercise: moderately active  DEXA 2011 at [facility] with osteopenia in hip, normal spine. Due for repeat.   She does get adequate calcium and Vitamin D in her diet.  Labs with PCP  Past Medical History:  Diagnosis Date   Endometriosis    Hyperlipidemia    Osteopenia    Vitamin D deficiency     Past Surgical History:  Procedure Laterality Date   BREAST BIOPSY Right    excisional late 1990's   BREAST EXCISIONAL BIOPSY     COLONOSCOPY  2017   with Dr. Vira Agar; repeat after 5 yrs   DIAGNOSTIC LAPAROSCOPY     TUBAL LIGATION     VAGINAL HYSTERECTOMY      Family History  Problem Relation Age of Onset   Stomach cancer Father    Breast cancer Brother 5       BRCA/panel neg   Breast cancer Cousin        not sure of age, not close with relative   Bone cancer Neg Hx    Hypertension Neg Hx     Social History   Socioeconomic  History   Marital status: Single    Spouse name: Not on file   Number of children: Not on file   Years of education: Not on file   Highest education level: Not on file  Occupational History   Not on file  Tobacco Use   Smoking status: Never   Smokeless tobacco: Never  Vaping Use   Vaping Use: Never used  Substance and Sexual Activity   Alcohol use: Yes   Drug use: No   Sexual activity: Yes    Birth control/protection: Surgical    Comment: Hysterectomy  Other Topics Concern   Not on file  Social History Narrative   Retired - Tax adviser      Social Determinants of Radio broadcast assistant Strain: Not on file  Food Insecurity: Not on file  Transportation Needs: Not on file  Physical Activity: Not on file  Stress: Not on file  Social Connections: Not on file  Intimate Partner Violence: Not on file    Current Meds  Medication Sig   Cholecalciferol 25 MCG (1000 UT) tablet Take by mouth.   traZODone (DESYREL) 50 MG tablet Take 0.5-1 tablets (25-50 mg total) by mouth at bedtime as needed for sleep.  ROS:  Review of Systems  Constitutional:  Negative for fatigue, fever and unexpected weight change.  Respiratory:  Negative for cough, shortness of breath and wheezing.   Cardiovascular:  Negative for chest pain, palpitations and leg swelling.  Gastrointestinal:  Negative for blood in stool, constipation, diarrhea, nausea and vomiting.  Endocrine: Negative for cold intolerance, heat intolerance and polyuria.  Genitourinary:  Negative for dyspareunia, dysuria, flank pain, frequency, genital sores, hematuria, menstrual problem, pelvic pain, urgency, vaginal bleeding, vaginal discharge and vaginal pain.  Musculoskeletal:  Negative for back pain, joint swelling and myalgias.  Skin:  Negative for rash.  Neurological:  Negative for dizziness, syncope, light-headedness, numbness and headaches.  Hematological:  Negative for adenopathy.  Psychiatric/Behavioral:   Negative for agitation, confusion, sleep disturbance and suicidal ideas. The patient is not nervous/anxious.     Objective: BP 124/76   Ht 5' 4"  (1.626 m)   Wt 144 lb (65.3 kg)   BMI 24.72 kg/m    Physical Exam Constitutional:      Appearance: She is well-developed.  Genitourinary:     Vulva normal.     Genitourinary Comments: UTERUS/CX SURG REM     Right Labia: No rash, tenderness or lesions.    Left Labia: No tenderness, lesions or rash.    Vaginal cuff intact.    No vaginal discharge, erythema or tenderness.      Right Adnexa: not tender and no mass present.    Left Adnexa: not tender and no mass present.    Cervix is absent.     Uterus is absent.  Breasts:    Right: No mass, nipple discharge, skin change or tenderness.     Left: No mass, nipple discharge, skin change or tenderness.  Neck:     Thyroid: No thyromegaly.  Cardiovascular:     Rate and Rhythm: Normal rate and regular rhythm.     Heart sounds: Normal heart sounds. No murmur heard. Pulmonary:     Effort: Pulmonary effort is normal.     Breath sounds: Normal breath sounds.  Abdominal:     Palpations: Abdomen is soft.     Tenderness: There is no abdominal tenderness. There is no guarding.  Musculoskeletal:        General: Normal range of motion.     Cervical back: Normal range of motion.  Neurological:     General: No focal deficit present.     Mental Status: She is alert and oriented to person, place, and time.     Cranial Nerves: No cranial nerve deficit.  Skin:    General: Skin is warm and dry.  Psychiatric:        Mood and Affect: Mood normal.        Behavior: Behavior normal.        Thought Content: Thought content normal.        Judgment: Judgment normal.  Vitals reviewed.    Assessment/Plan:  Encounter for annual routine gynecological examination  Encounter for screening mammogram for malignant neoplasm of breast - Plan: MM 3D SCREEN BREAST BILATERAL; pt to sched mammo  Family history  of breast cancer--her brother is BRCA/panel neg.   Screening for colon cancer--pt will do GI ref with PCP  Screening for osteoporosis - Plan: DG Bone Density; pt to sched DEXA with mammo. Cont ca/Vit D/exercise  Osteopenia of neck of femur, unspecified laterality - Plan: DG Bone Density  Other specified disorders of bone density and structure, multiple sites  - Plan: DG Bone Density  GYN counsel mammography screening, adequate intake of calcium and vitamin D, diet and exercise    F/U  Return in about 2 years (around 09/17/2022). (Pt still wants to come yearly and pay for it).  Shantal Roan B. Raevin Wierenga, PA-C 09/16/2020 10:57 AM

## 2020-09-16 ENCOUNTER — Other Ambulatory Visit: Payer: Self-pay

## 2020-09-16 ENCOUNTER — Ambulatory Visit (INDEPENDENT_AMBULATORY_CARE_PROVIDER_SITE_OTHER): Payer: Medicare Other | Admitting: Obstetrics and Gynecology

## 2020-09-16 ENCOUNTER — Encounter: Payer: Self-pay | Admitting: Obstetrics and Gynecology

## 2020-09-16 VITALS — BP 124/76 | Ht 64.0 in | Wt 144.0 lb

## 2020-09-16 DIAGNOSIS — Z13820 Encounter for screening for osteoporosis: Secondary | ICD-10-CM

## 2020-09-16 DIAGNOSIS — M8589 Other specified disorders of bone density and structure, multiple sites: Secondary | ICD-10-CM

## 2020-09-16 DIAGNOSIS — Z1231 Encounter for screening mammogram for malignant neoplasm of breast: Secondary | ICD-10-CM

## 2020-09-16 DIAGNOSIS — Z01419 Encounter for gynecological examination (general) (routine) without abnormal findings: Secondary | ICD-10-CM

## 2020-09-16 DIAGNOSIS — M85859 Other specified disorders of bone density and structure, unspecified thigh: Secondary | ICD-10-CM

## 2020-09-16 DIAGNOSIS — Z1211 Encounter for screening for malignant neoplasm of colon: Secondary | ICD-10-CM

## 2020-09-16 DIAGNOSIS — Z803 Family history of malignant neoplasm of breast: Secondary | ICD-10-CM

## 2020-09-16 NOTE — Patient Instructions (Addendum)
I value your feedback and you entrusting us with your care. If you get a [facility] patient survey, I would appreciate you taking the time to let us know about your experience today. Thank you!  [facility]: [phone]      

## 2020-09-29 ENCOUNTER — Ambulatory Visit: Payer: Self-pay | Admitting: Family

## 2020-10-21 ENCOUNTER — Other Ambulatory Visit: Payer: Self-pay

## 2020-10-21 ENCOUNTER — Ambulatory Visit
Admission: RE | Admit: 2020-10-21 | Discharge: 2020-10-21 | Disposition: A | Discharge status: Home / Self Care | Payer: Medicare Other | Source: Ambulatory Visit | Attending: Obstetrics and Gynecology | Admitting: Obstetrics and Gynecology

## 2020-10-21 DIAGNOSIS — Z1231 Encounter for screening mammogram for malignant neoplasm of breast: Secondary | ICD-10-CM | POA: Insufficient documentation

## 2020-10-21 DIAGNOSIS — Z13820 Encounter for screening for osteoporosis: Secondary | ICD-10-CM | POA: Insufficient documentation

## 2020-10-21 DIAGNOSIS — M8589 Other specified disorders of bone density and structure, multiple sites: Secondary | ICD-10-CM | POA: Diagnosis not present

## 2020-10-21 DIAGNOSIS — Z780 Asymptomatic menopausal state: Secondary | ICD-10-CM | POA: Diagnosis not present

## 2020-10-21 DIAGNOSIS — M85859 Other specified disorders of bone density and structure, unspecified thigh: Secondary | ICD-10-CM | POA: Insufficient documentation

## 2020-10-28 ENCOUNTER — Ambulatory Visit (INDEPENDENT_AMBULATORY_CARE_PROVIDER_SITE_OTHER): Payer: Medicare Other | Admitting: Family

## 2020-10-28 ENCOUNTER — Other Ambulatory Visit: Payer: Self-pay

## 2020-10-28 ENCOUNTER — Encounter: Payer: Self-pay | Admitting: Family

## 2020-10-28 VITALS — BP 150/70 | HR 90 | Temp 98.0°F | Ht 64.0 in | Wt 142.6 lb

## 2020-10-28 DIAGNOSIS — E559 Vitamin D deficiency, unspecified: Secondary | ICD-10-CM | POA: Diagnosis not present

## 2020-10-28 DIAGNOSIS — Z87898 Personal history of other specified conditions: Secondary | ICD-10-CM

## 2020-10-28 DIAGNOSIS — R030 Elevated blood-pressure reading, without diagnosis of hypertension: Secondary | ICD-10-CM

## 2020-10-28 DIAGNOSIS — G4700 Insomnia, unspecified: Secondary | ICD-10-CM

## 2020-10-28 DIAGNOSIS — Z13220 Encounter for screening for lipoid disorders: Secondary | ICD-10-CM | POA: Diagnosis not present

## 2020-10-28 DIAGNOSIS — Z136 Encounter for screening for cardiovascular disorders: Secondary | ICD-10-CM

## 2020-10-28 DIAGNOSIS — F419 Anxiety disorder, unspecified: Secondary | ICD-10-CM

## 2020-10-28 LAB — COMPREHENSIVE METABOLIC PANEL
ALT: 14 U/L (ref 0–35)
AST: 20 U/L (ref 0–37)
Albumin: 4.6 g/dL (ref 3.5–5.2)
Alkaline Phosphatase: 82 U/L (ref 39–117)
BUN: 17 mg/dL (ref 6–23)
CO2: 30 mEq/L (ref 19–32)
Calcium: 10.1 mg/dL (ref 8.4–10.5)
Chloride: 100 mEq/L (ref 96–112)
Creatinine, Ser: 0.99 mg/dL (ref 0.40–1.20)
GFR: 59.95 mL/min — ABNORMAL LOW (ref >60.00)
Glucose, Bld: 88 mg/dL (ref 70–99)
Potassium: 4.4 mEq/L (ref 3.5–5.1)
Sodium: 139 mEq/L (ref 135–145)
Total Bilirubin: 0.9 mg/dL (ref 0.2–1.2)
Total Protein: 7.5 g/dL (ref 6.0–8.3)

## 2020-10-28 LAB — VITAMIN D 25 HYDROXY (VIT D DEFICIENCY, FRACTURES): VITD: 32.09 ng/mL (ref 30.00–100.00)

## 2020-10-28 LAB — LIPID PANEL
Cholesterol: 254 mg/dL — ABNORMAL HIGH (ref 0–200)
HDL: 75.4 mg/dL (ref >39.00)
LDL Cholesterol: 161 mg/dL — ABNORMAL HIGH (ref 0–99)
NonHDL: 178.42
Total CHOL/HDL Ratio: 3
Triglycerides: 85 mg/dL (ref 0.0–149.0)
VLDL: 17 mg/dL (ref 0.0–40.0)

## 2020-10-28 MED ORDER — TRAZODONE HCL 50 MG PO TABS: 25.0000 mg | ORAL_TABLET | Freq: Every evening | ORAL | 0 refills | Status: DC | PRN

## 2020-10-28 NOTE — Assessment & Plan Note (Signed)
Chronic, stable continue trazodone 25 mg nightly as needed

## 2020-10-28 NOTE — Patient Instructions (Signed)
It is imperative that you are seen AT least twice per year for labs and monitoring. Monitor blood pressure at home and me 5-6 reading on separate days. Goal is less than 120/80, based on newest guidelines, however we certainly want to be less than 130/80;  if persistently higher, please make sooner follow up appointment so we can recheck you blood pressure and manage/ adjust medications.  

## 2020-10-28 NOTE — Progress Notes (Signed)
Subjective:    Patient ID: Colleen Stephenson, female    DOB: 1955-05-14, 65 y.o.   MRN: [id number]  CC: Colleen Stephenson is a 65 y.o. female who presents today for follow up.   HPI: Elevated blood pressure. She thinks white coat syndrome as she has been diligently monitoring blood pressure at home and has purchased a blood pressure cuff.  Readings at home consistently less than 120/70. No cp, sob.  No nsaids, sudafed.  Trouble sleeping-compliant with trazodone 31m prn with relief.   She continues to follow with Colleen Colleen Stephenson subclinical hypothyroidism.  More exercise with new puppy. Walking 2-5 miles per day Eating less sugar. Lost 10-15 lbs.   Follows with AFraser Din PA at GYN.  Last Pap 02/02/2017 Stephenson from GYN as no abnormalities, negative HPV.  Colleen Stephenson ordered DEXA,mammogram 10/21/20  Due colonoscopy, last done Colleen Colleen Agarreported in chart Alcohol use occasional     HISTORY:  Past Medical History:  Diagnosis Date   Endometriosis    Hyperlipidemia    Osteopenia    Vitamin D deficiency    Past Surgical History:  Procedure Laterality Date   BREAST BIOPSY Right    excisional late 1990's   BREAST EXCISIONAL BIOPSY     COLONOSCOPY  2017   with Colleen. EVira Stephenson repeat after 5 yrs   DIAGNOSTIC LAPAROSCOPY     TUBAL LIGATION     VAGINAL HYSTERECTOMY     Family History  Problem Relation Age of Onset   Stomach cancer Father    Breast cancer Brother 638      BRCA/panel neg   Breast cancer Cousin        not sure of age, not close with relative   Bone cancer Neg Hx    Hypertension Neg Hx     Allergies: Amoxicillin-pot clavulanate Current Outpatient Medications on File Prior to Visit  Medication Sig Dispense Refill   Calcium Carb-Cholecalciferol (CALCIUM 600+D) 600-800 MG-UNIT TABS Take 1 tablet by mouth daily.     Cholecalciferol 25 MCG (1000 UT) tablet Take by mouth.     No current facility-administered medications on file prior to visit.    Social History    Tobacco Use   Smoking status: Never   Smokeless tobacco: Never  Vaping Use   Vaping Use: Never used  Substance Use Topics   Alcohol use: Yes    Comment: couple times of week wine or liquor.   Drug use: No    Review of Systems  Constitutional:  Negative for chills and fever.  Respiratory:  Negative for cough.   Cardiovascular:  Negative for chest pain and palpitations.  Gastrointestinal:  Negative for nausea and vomiting.  Psychiatric/Behavioral:  Negative for sleep disturbance.      Objective:    BP (!) 150/70 (BP Location: Left Arm, Patient Position: Sitting, Cuff Size: Normal)   Pulse 90   Temp 98 F (36.7 C) (Oral)   Ht _0  (1.626 m)   Wt 142 lb 9.6 oz (64.7 kg)   SpO2 98%   BMI 24.48 kg/m  BP Readings from Last 3 Encounters:  10/28/20 (!) 150/70  09/16/20 124/76  09/23/19 132/74   Wt Readings from Last 3 Encounters:  10/28/20 142 lb 9.6 oz (64.7 kg)  09/16/20 144 lb (65.3 kg)  06/18/20 150 lb (68 kg)    Physical Exam Vitals reviewed.  Constitutional:      Appearance: She is well-developed.  Eyes:     Conjunctiva/sclera: Conjunctivae  normal.  Cardiovascular:     Rate and Rhythm: Normal rate and regular rhythm.     Pulses: Normal pulses.     Heart sounds: Normal heart sounds.  Pulmonary:     Effort: Pulmonary effort is normal.     Breath sounds: Normal breath sounds. No wheezing, rhonchi or rales.  Skin:    General: Skin is warm and dry.  Neurological:     Mental Status: She is alert.  Psychiatric:        Speech: Speech normal.        Behavior: Behavior normal.        Thought Content: Thought content normal.       Assessment & Plan:   Problem List Items Addressed This Visit       Other   Elevated blood pressure reading    Patient blood pressure readings are very well controlled.  In the office she still demonstrates higher blood pressure.  We discussed trying a low-dose blood pressure medication; patient very politely declines as she  would like to continue to monitor at home.  She will call me with any concerns.      Insomnia    Chronic, stable continue trazodone 25 mg nightly as needed      Other Visit Diagnoses     Vitamin D deficiency    -  Primary   Relevant Orders   VITAMIN D 25 Hydroxy (Vit-D Deficiency, Fractures)   Comprehensive metabolic panel   Encounter for lipid screening for cardiovascular disease       Relevant Orders   Lipid panel   History of prediabetes            I am having Colleen Stephenson maintain her Cholecalciferol and Calcium 600+D.   No orders of the defined types were placed in this encounter.   Return precautions given.   Risks, benefits, and alternatives of the medications and treatment plan prescribed today were discussed, and patient expressed understanding.   Education regarding symptom management and diagnosis given to patient on AVS.  Continue to follow with Colleen Hawthorne, FNP for routine health maintenance.   Colleen Stephenson and I agreed with plan.   Colleen Paris, FNP

## 2020-10-28 NOTE — Assessment & Plan Note (Signed)
Patient blood pressure readings are very well controlled.  In the office she still demonstrates higher blood pressure.  We discussed trying a low-dose blood pressure medication; patient very politely declines as she would like to continue to monitor at home.  She will call me with any concerns.

## 2020-10-30 ENCOUNTER — Other Ambulatory Visit: Payer: Self-pay

## 2020-10-30 ENCOUNTER — Encounter: Payer: Self-pay | Admitting: Family

## 2020-10-30 DIAGNOSIS — E785 Hyperlipidemia, unspecified: Secondary | ICD-10-CM

## 2020-10-30 MED ORDER — ROSUVASTATIN CALCIUM 5 MG PO TABS
5.0000 mg | ORAL_TABLET | Freq: Every day | ORAL | 3 refills | Status: DC
Start: 2020-10-30 — End: 2020-12-28

## 2020-11-04 DIAGNOSIS — H2513 Age-related nuclear cataract, bilateral: Secondary | ICD-10-CM | POA: Diagnosis not present

## 2020-11-16 DIAGNOSIS — L2389 Allergic contact dermatitis due to other agents: Secondary | ICD-10-CM | POA: Diagnosis not present

## 2020-11-19 ENCOUNTER — Encounter: Payer: Self-pay | Admitting: Family

## 2020-11-25 ENCOUNTER — Other Ambulatory Visit: Payer: Self-pay

## 2020-11-25 DIAGNOSIS — E785 Hyperlipidemia, unspecified: Secondary | ICD-10-CM

## 2020-11-25 DIAGNOSIS — Z13220 Encounter for screening for lipoid disorders: Secondary | ICD-10-CM

## 2020-11-25 NOTE — Addendum Note (Signed)
Addended by: Earlyne Iba on: 11/25/2020 08:55 AM   Modules accepted: Orders

## 2020-12-16 ENCOUNTER — Other Ambulatory Visit: Payer: Medicare Other

## 2020-12-27 ENCOUNTER — Encounter: Payer: Self-pay | Admitting: Family

## 2020-12-28 ENCOUNTER — Other Ambulatory Visit: Payer: Self-pay | Admitting: Family

## 2020-12-28 DIAGNOSIS — E785 Hyperlipidemia, unspecified: Secondary | ICD-10-CM | POA: Insufficient documentation

## 2021-02-01 ENCOUNTER — Other Ambulatory Visit: Payer: Medicare Other

## 2021-08-13 ENCOUNTER — Ambulatory Visit: Payer: Medicare Other | Admitting: Family

## 2021-08-16 ENCOUNTER — Ambulatory Visit: Payer: Medicare Other

## 2021-09-09 DIAGNOSIS — E038 Other specified hypothyroidism: Secondary | ICD-10-CM | POA: Diagnosis not present

## 2021-09-10 ENCOUNTER — Other Ambulatory Visit: Payer: Self-pay | Admitting: Obstetrics and Gynecology

## 2021-09-10 DIAGNOSIS — Z1231 Encounter for screening mammogram for malignant neoplasm of breast: Secondary | ICD-10-CM

## 2021-09-15 DIAGNOSIS — L578 Other skin changes due to chronic exposure to nonionizing radiation: Secondary | ICD-10-CM | POA: Diagnosis not present

## 2021-09-15 DIAGNOSIS — Z86018 Personal history of other benign neoplasm: Secondary | ICD-10-CM | POA: Diagnosis not present

## 2021-09-15 DIAGNOSIS — L2389 Allergic contact dermatitis due to other agents: Secondary | ICD-10-CM | POA: Diagnosis not present

## 2021-09-15 DIAGNOSIS — D2262 Melanocytic nevi of left upper limb, including shoulder: Secondary | ICD-10-CM | POA: Diagnosis not present

## 2021-09-15 DIAGNOSIS — D225 Melanocytic nevi of trunk: Secondary | ICD-10-CM | POA: Diagnosis not present

## 2021-09-15 DIAGNOSIS — Z872 Personal history of diseases of the skin and subcutaneous tissue: Secondary | ICD-10-CM | POA: Diagnosis not present

## 2021-09-16 ENCOUNTER — Telehealth: Payer: Self-pay | Admitting: Family

## 2021-09-16 NOTE — Telephone Encounter (Signed)
Copied from [place] [phone]. Topic: Medicare AWV >> Sep 16, 2021  1:45 PM Devoria Glassing wrote: Reason for CRM: Left message for patient to schedule Annual Wellness Visit.  Please schedule with Nurse Health Advisor Denisa O'Brien-Blaney, LPN at [facility]. This appt can be telephone or office visit.  Please call [phone] ask for [facility]

## 2021-09-20 DIAGNOSIS — E038 Other specified hypothyroidism: Secondary | ICD-10-CM | POA: Diagnosis not present

## 2021-09-20 DIAGNOSIS — E063 Autoimmune thyroiditis: Secondary | ICD-10-CM | POA: Diagnosis not present

## 2021-10-05 ENCOUNTER — Telehealth: Payer: Self-pay | Admitting: Family

## 2021-10-05 NOTE — Telephone Encounter (Signed)
Copied from [place] [phone]. Topic: Medicare AWV >> Oct 05, 2021 11:49 AM Devoria Glassing wrote: Reason for CRM: Left message for patient to schedule Annual Wellness Visit.  Please schedule with Nurse Health Advisor Denisa O'Brien-Blaney, LPN at [facility]. This appt can be telephone or office visit.  Please call [phone] ask for [facility]

## 2021-10-08 DIAGNOSIS — L298 Other pruritus: Secondary | ICD-10-CM | POA: Diagnosis not present

## 2021-10-26 ENCOUNTER — Ambulatory Visit
Admission: RE | Admit: 2021-10-26 | Discharge: 2021-10-26 | Disposition: A | Discharge status: Home / Self Care | Payer: Medicare Other | Source: Ambulatory Visit | Attending: Obstetrics and Gynecology | Admitting: Obstetrics and Gynecology

## 2021-10-26 DIAGNOSIS — Z1231 Encounter for screening mammogram for malignant neoplasm of breast: Secondary | ICD-10-CM | POA: Insufficient documentation

## 2021-10-27 ENCOUNTER — Other Ambulatory Visit: Payer: Self-pay | Admitting: Obstetrics and Gynecology

## 2021-10-27 DIAGNOSIS — R928 Other abnormal and inconclusive findings on diagnostic imaging of breast: Secondary | ICD-10-CM

## 2021-10-27 DIAGNOSIS — R921 Mammographic calcification found on diagnostic imaging of breast: Secondary | ICD-10-CM

## 2021-11-01 ENCOUNTER — Other Ambulatory Visit: Payer: Self-pay

## 2021-11-01 ENCOUNTER — Encounter: Payer: Self-pay | Admitting: Family

## 2021-11-01 ENCOUNTER — Ambulatory Visit (INDEPENDENT_AMBULATORY_CARE_PROVIDER_SITE_OTHER): Payer: Medicare Other | Admitting: Family

## 2021-11-01 VITALS — BP 138/88 | HR 65 | Temp 97.9°F | Ht 63.0 in | Wt 142.8 lb

## 2021-11-01 DIAGNOSIS — G4700 Insomnia, unspecified: Secondary | ICD-10-CM

## 2021-11-01 DIAGNOSIS — Z0000 Encounter for general adult medical examination without abnormal findings: Secondary | ICD-10-CM

## 2021-11-01 DIAGNOSIS — Z1211 Encounter for screening for malignant neoplasm of colon: Secondary | ICD-10-CM | POA: Diagnosis not present

## 2021-11-01 DIAGNOSIS — I10 Essential (primary) hypertension: Secondary | ICD-10-CM

## 2021-11-01 DIAGNOSIS — F419 Anxiety disorder, unspecified: Secondary | ICD-10-CM

## 2021-11-01 DIAGNOSIS — E785 Hyperlipidemia, unspecified: Secondary | ICD-10-CM

## 2021-11-01 LAB — COMPREHENSIVE METABOLIC PANEL
ALT: 14 U/L (ref 0–35)
AST: 19 U/L (ref 0–37)
Albumin: 4.6 g/dL (ref 3.5–5.2)
Alkaline Phosphatase: 84 U/L (ref 39–117)
BUN: 20 mg/dL (ref 6–23)
CO2: 29 mEq/L (ref 19–32)
Calcium: 10 mg/dL (ref 8.4–10.5)
Chloride: 100 mEq/L (ref 96–112)
Creatinine, Ser: 1.12 mg/dL (ref 0.40–1.20)
GFR: 51.34 mL/min — ABNORMAL LOW (ref >60.00)
Glucose, Bld: 91 mg/dL (ref 70–99)
Potassium: 4.5 mEq/L (ref 3.5–5.1)
Sodium: 139 mEq/L (ref 135–145)
Total Bilirubin: 0.8 mg/dL (ref 0.2–1.2)
Total Protein: 7.7 g/dL (ref 6.0–8.3)

## 2021-11-01 LAB — LIPID PANEL
Cholesterol: 253 mg/dL — ABNORMAL HIGH (ref 0–200)
HDL: 80.2 mg/dL (ref >39.00)
LDL Cholesterol: 155 mg/dL — ABNORMAL HIGH (ref 0–99)
NonHDL: 173.01
Total CHOL/HDL Ratio: 3
Triglycerides: 90 mg/dL (ref 0.0–149.0)
VLDL: 18 mg/dL (ref 0.0–40.0)

## 2021-11-01 LAB — CBC WITH DIFFERENTIAL/PLATELET
Basophils Absolute: 0 10*3/uL (ref 0.0–0.1)
Basophils Relative: 1 % (ref 0.0–3.0)
Eosinophils Absolute: 0.1 10*3/uL (ref 0.0–0.7)
Eosinophils Relative: 3.4 % (ref 0.0–5.0)
HCT: 41.4 % (ref 36.0–46.0)
Hemoglobin: 14.1 g/dL (ref 12.0–15.0)
Lymphocytes Relative: 29 % (ref 12.0–46.0)
Lymphs Abs: 1.2 10*3/uL (ref 0.7–4.0)
MCHC: 34 g/dL (ref 30.0–36.0)
MCV: 93.5 fl (ref 78.0–100.0)
Monocytes Absolute: 0.3 10*3/uL (ref 0.1–1.0)
Monocytes Relative: 8 % (ref 3.0–12.0)
Neutro Abs: 2.4 10*3/uL (ref 1.4–7.7)
Neutrophils Relative %: 58.6 % (ref 43.0–77.0)
Platelets: 249 10*3/uL (ref 150.0–400.0)
RBC: 4.42 Mil/uL (ref 3.87–5.11)
RDW: 12.7 % (ref 11.5–15.5)
WBC: 4.2 10*3/uL (ref 4.0–10.5)

## 2021-11-01 LAB — HEMOGLOBIN A1C: Hgb A1c MFr Bld: 5.7 % (ref 4.6–6.5)

## 2021-11-01 LAB — VITAMIN D 25 HYDROXY (VIT D DEFICIENCY, FRACTURES): VITD: 41.25 ng/mL (ref 30.00–100.00)

## 2021-11-01 MED ORDER — AMLODIPINE BESYLATE 2.5 MG PO TABS: 2.5000 mg | ORAL_TABLET | Freq: Every day | ORAL | 3 refills | Status: DC

## 2021-11-01 MED ORDER — TRAZODONE HCL 50 MG PO TABS: 25.0000 mg | ORAL_TABLET | Freq: Every evening | ORAL | 2 refills | Status: DC | PRN

## 2021-11-01 NOTE — Patient Instructions (Addendum)
Please call  and schedule your 3D mammogram as we discussed.   [facility]  ( new location in 2023)  [address], [place],  [address]  [place], [place]   Start amlodipine 2.'5mg'$   It is imperative that you are seen AT least twice per year for labs and monitoring. Monitor blood pressure at home and me 5-6 reading on separate days. Goal is less than 120/80, based on newest guidelines, however we certainly want to be less than 130/80;  if persistently higher, please make sooner follow up appointment so we can recheck you blood pressure and manage/ adjust medications.   Referral to gastroenterology and cardiology Let us know if you dont hear back within a week in regards to an appointment being scheduled.   Health Maintenance for Postmenopausal Women Menopause is a normal process in which your ability to get pregnant comes to an end. This process happens slowly over many months or years, usually between the ages of 69 and 49. Menopause is complete when you have missed your menstrual period for 12 months. It is important to talk with your health care provider about some of the most common conditions that affect women after menopause (postmenopausal women). These include heart disease, cancer, and bone loss (osteoporosis). Adopting a healthy lifestyle and getting preventive care can help to promote your health and wellness. The actions you take can also lower your chances of developing some of these common conditions. What are the signs and symptoms of menopause? During menopause, you may have the following symptoms: Hot flashes. These can be moderate or severe. Night sweats. Decrease in sex drive. Mood swings. Headaches. Tiredness (fatigue). Irritability. Memory problems. Problems falling asleep or staying asleep. Talk with your health care provider about treatment options for your symptoms. Do I need hormone replacement therapy? Hormone  replacement therapy is effective in treating symptoms that are caused by menopause, such as hot flashes and night sweats. Hormone replacement carries certain risks, especially as you become older. If you are thinking about using estrogen or estrogen with progestin, discuss the benefits and risks with your health care provider. How can I reduce my risk for heart disease and stroke? The risk of heart disease, heart attack, and stroke increases as you age. One of the causes may be a change in the body's hormones during menopause. This can affect how your body uses dietary fats, triglycerides, and cholesterol. Heart attack and stroke are medical emergencies. There are many things that you can do to help prevent heart disease and stroke. Watch your blood pressure High blood pressure causes heart disease and increases the risk of stroke. This is more likely to develop in people who have high blood pressure readings or are overweight. Have your blood pressure checked: Every 3-5 years if you are 54-27 years of age. Every year if you are 29 years old or older. Eat a healthy diet  Eat a diet that includes plenty of vegetables, fruits, low-fat dairy products, and lean protein. Do not eat a lot of foods that are high in solid fats, added sugars, or sodium. Get regular exercise Get regular exercise. This is one of the most important things you can do for your health. Most adults should: Try to exercise for at least 150 minutes each week. The exercise should increase your heart rate and make you sweat (moderate-intensity exercise). Try to do strengthening exercises at least twice each week. Do these in addition to the moderate-intensity exercise. Spend less  time sitting. Even light physical activity can be beneficial. Other tips Work with your health care provider to achieve or maintain a healthy weight. Do not use any products that contain nicotine or tobacco. These products include cigarettes, chewing  tobacco, and vaping devices, such as e-cigarettes. If you need help quitting, ask your health care provider. Know your numbers. Ask your health care provider to check your cholesterol and your blood sugar (glucose). Continue to have your blood tested as directed by your health care provider. Do I need screening for cancer? Depending on your health history and family history, you may need to have cancer screenings at different stages of your life. This may include screening for: Breast cancer. Cervical cancer. Lung cancer. Colorectal cancer. What is my risk for osteoporosis? After menopause, you may be at increased risk for osteoporosis. Osteoporosis is a condition in which bone destruction happens more quickly than new bone creation. To help prevent osteoporosis or the bone fractures that can happen because of osteoporosis, you may take the following actions: If you are 47-42 years old, get at least 1,000 mg of calcium and at least 600 international units (IU) of vitamin D per day. If you are older than age 42 but younger than age 23, get at least 1,200 mg of calcium and at least 600 international units (IU) of vitamin D per day. If you are older than age 61, get at least 1,200 mg of calcium and at least 800 international units (IU) of vitamin D per day. Smoking and drinking excessive alcohol increase the risk of osteoporosis. Eat foods that are rich in calcium and vitamin D, and do weight-bearing exercises several times each week as directed by your health care provider. How does menopause affect my mental health? Depression may occur at any age, but it is more common as you become older. Common symptoms of depression include: Feeling depressed. Changes in sleep patterns. Changes in appetite or eating patterns. Feeling an overall lack of motivation or enjoyment of activities that you previously enjoyed. Frequent crying spells. Talk with your health care provider if you think that you are  experiencing any of these symptoms. General instructions See your health care provider for regular wellness exams and vaccines. This may include: Scheduling regular health, dental, and eye exams. Getting and maintaining your vaccines. These include: Influenza vaccine. Get this vaccine each year before the flu season begins. Pneumonia vaccine. Shingles vaccine. Tetanus, diphtheria, and pertussis (Tdap) booster vaccine. Your health care provider may also recommend other immunizations. Tell your health care provider if you have ever been abused or do not feel safe at home. Summary Menopause is a normal process in which your ability to get pregnant comes to an end. This condition causes hot flashes, night sweats, decreased interest in sex, mood swings, headaches, or lack of sleep. Treatment for this condition may include hormone replacement therapy. Take actions to keep yourself healthy, including exercising regularly, eating a healthy diet, watching your weight, and checking your blood pressure and blood sugar levels. Get screened for cancer and depression. Make sure that you are up to date with all your vaccines. This information is not intended to replace advice given to you by your health care provider. Make sure you discuss any questions you have with your health care provider. Document Revised: 06/01/2020 Document Reviewed: 06/01/2020 Elsevier Patient Education  [place].

## 2021-11-01 NOTE — Assessment & Plan Note (Addendum)
Patient will schedule right breast diagnostic mammogram. Patient declines clinical breast exam.  Pap smear is up-to-date and she follows with GYN.

## 2021-11-01 NOTE — Progress Notes (Signed)
Subjective:    Patient ID: Colleen Stephenson, female    DOB: [birth year], 66 y.o.   MRN: [id number]  CC: Colleen Stephenson is a 66 y.o. female who presents today for physical exam.    HPI: She is doing well today.  No new complaints.  She takes trazodone 50 mg as needed which is helpful for sleep . requests refill of trazodone  HLD- she would be interested in starting lipitor if she needed however she would like pursue hypothyroidism.   No cp, sob.   She continues to follow with Dr Phillip Heal, dermatology.   Colorectal Cancer Screening:due, previously done with Dr. Vira Agar Breast Cancer Screening: Mammogram incomplete, she requires right breast diagnostic MM.  Cervical Cancer Screening: UTD Bone Health screening/DEXA for 65+: due 09/2022, History of osteopenia  Lung Cancer Screening: Doesn't have 20 year pack year history and age > 16 years yo 34 years        Tetanus - due; declines        Pneumococcal - due, declines  Labs: Screening labs today. Exercise: Gets regular exercise, walking the dog, gardening.   Alcohol use: Occasional Smoking/tobacco use: Nonsmoker.     HISTORY:  Past Medical History:  Diagnosis Date   Endometriosis    Hyperlipidemia    Osteopenia    Vitamin D deficiency     Past Surgical History:  Procedure Laterality Date   BREAST BIOPSY Right    excisional late 1990's   BREAST EXCISIONAL BIOPSY     COLONOSCOPY  2017   with Dr. Vira Agar; repeat after 5 yrs   DIAGNOSTIC LAPAROSCOPY     TUBAL LIGATION     VAGINAL HYSTERECTOMY     Family History  Problem Relation Age of Onset   Stomach cancer Father    Breast cancer Brother 45       BRCA/panel neg   Breast cancer Cousin        not sure of age, not close with relative   Bone cancer Neg Hx    Hypertension Neg Hx       ALLERGIES: Amoxicillin-pot clavulanate  Current Outpatient Medications on File Prior to Visit  Medication Sig Dispense Refill   Calcium Carb-Cholecalciferol (CALCIUM 600+D) 600-800  MG-UNIT TABS Take 1 tablet by mouth daily.     Cholecalciferol 25 MCG (1000 UT) tablet Take by mouth.     No current facility-administered medications on file prior to visit.    Social History   Tobacco Use   Smoking status: Never   Smokeless tobacco: Never  Vaping Use   Vaping Use: Never used  Substance Use Topics   Alcohol use: Yes    Comment: couple times of week wine or liquor.   Drug use: No    Review of Systems  Constitutional:  Negative for chills, fever and unexpected weight change.  HENT:  Negative for congestion.   Respiratory:  Negative for cough.   Cardiovascular:  Negative for chest pain, palpitations and leg swelling.  Gastrointestinal:  Negative for nausea and vomiting.  Musculoskeletal:  Negative for arthralgias and myalgias.  Skin:  Negative for rash.  Neurological:  Negative for headaches.  Hematological:  Negative for adenopathy.  Psychiatric/Behavioral:  Negative for confusion.       Objective:    BP 138/88   Pulse 65   Temp 97.9 F (36.6 C) (Oral)   Ht 5' 3"  (1.6 m)   Wt 142 lb 12.8 oz (64.8 kg)   SpO2 98%   BMI 25.30  kg/m   BP Readings from Last 3 Encounters:  11/01/21 138/88  10/28/20 (!) 150/70  09/16/20 124/76   Wt Readings from Last 3 Encounters:  11/01/21 142 lb 12.8 oz (64.8 kg)  10/28/20 142 lb 9.6 oz (64.7 kg)  09/16/20 144 lb (65.3 kg)    Physical Exam Vitals reviewed.  Constitutional:      Appearance: She is well-developed.  Eyes:     Conjunctiva/sclera: Conjunctivae normal.  Neck:     Thyroid: No thyroid mass or thyromegaly.  Cardiovascular:     Rate and Rhythm: Normal rate and regular rhythm.     Pulses: Normal pulses.     Heart sounds: Normal heart sounds.  Pulmonary:     Effort: Pulmonary effort is normal.     Breath sounds: Normal breath sounds. No wheezing, rhonchi or rales.  Lymphadenopathy:     Head:     Right side of head: No submental, submandibular, tonsillar, preauricular, posterior auricular or  occipital adenopathy.     Left side of head: No submental, submandibular, tonsillar, preauricular, posterior auricular or occipital adenopathy.     Cervical: No cervical adenopathy.  Skin:    General: Skin is warm and dry.  Neurological:     Mental Status: She is alert.  Psychiatric:        Speech: Speech normal.        Behavior: Behavior normal.        Thought Content: Thought content normal.        Assessment & Plan:   Problem List Items Addressed This Visit       Cardiovascular and Mediastinum   HTN (hypertension)    Elevated.  Start amlodipine 2.5 mg.  Discussed blood pressure goal of less than 120/80 or 130/80 depending on what feels comfortable for patient.       Relevant Medications   amLODipine (NORVASC) 2.5 MG tablet     Other   Annual physical exam - Primary    Patient will schedule right breast diagnostic mammogram. Patient declines clinical breast exam.  Pap smear is up-to-date and she follows with GYN.       Relevant Medications   amLODipine (NORVASC) 2.5 MG tablet   Other Relevant Orders   Ambulatory referral to Gastroenterology   CBC with Differential/Platelet   Comprehensive metabolic panel   Hemoglobin A1c   Lipid panel   VITAMIN D 25 Hydroxy (Vit-D Deficiency, Fractures)   Ambulatory referral to Cardiology   Hyperlipidemia    Pending lipid panel.  Patient in agreement of seeing cardiology for restratification and primarily to discuss CT calcium score to help make decision about trialing Lipitor.  Referral has been placed to [facility]      Relevant Medications   amLODipine (NORVASC) 2.5 MG tablet   Insomnia    Chronic, stable.  Continue trazodone 25 to 50 mg nightly as needed      Relevant Medications   traZODone (DESYREL) 50 MG tablet   Other Visit Diagnoses     Screening for colon cancer       Relevant Orders   Ambulatory referral to Gastroenterology   Anxiety       Relevant Medications   traZODone (DESYREL) 50 MG tablet         I am having Colleen Stephenson start on amLODipine. I am also having her maintain her Cholecalciferol, Calcium 600+D, and traZODone.   Meds ordered this encounter  Medications   amLODipine (NORVASC) 2.5 MG tablet    Sig: Take  1 tablet (2.5 mg total) by mouth daily.    Dispense:  90 tablet    Refill:  3    Order Specific Question:   Supervising Provider    Answer:   Deborra Medina L [[id number]]   traZODone (DESYREL) 50 MG tablet    Sig: Take 0.5-1 tablets (25-50 mg total) by mouth at bedtime as needed for sleep.    Dispense:  30 tablet    Refill:  2    Order Specific Question:   Supervising Provider    Answer:   Crecencio Mc [[id number]]    Return precautions given.   Risks, benefits, and alternatives of the medications and treatment plan prescribed today were discussed, and patient expressed understanding.   Education regarding symptom management and diagnosis given to patient on AVS.   Continue to follow with Colleen Hawthorne, FNP for routine health maintenance.   Colleen Stephenson and I agreed with plan.   Mable Paris, FNP

## 2021-11-01 NOTE — Assessment & Plan Note (Signed)
Elevated.  Start amlodipine 2.5 mg.  Discussed blood pressure goal of less than 120/80 or 130/80 depending on what feels comfortable for patient.

## 2021-11-01 NOTE — Assessment & Plan Note (Signed)
Chronic, stable.  Continue trazodone 25 to 50 mg nightly as needed

## 2021-11-01 NOTE — Assessment & Plan Note (Signed)
Pending lipid panel.  Patient in agreement of seeing cardiology for restratification and primarily to discuss CT calcium score to help make decision about trialing Lipitor.  Referral has been placed to [facility] clinic

## 2021-11-03 ENCOUNTER — Encounter: Payer: Self-pay | Admitting: Family

## 2021-11-16 ENCOUNTER — Telehealth: Payer: Self-pay | Admitting: Family

## 2021-11-16 DIAGNOSIS — H2513 Age-related nuclear cataract, bilateral: Secondary | ICD-10-CM | POA: Diagnosis not present

## 2021-11-16 NOTE — Telephone Encounter (Signed)
Copied from [place] [id number]. Topic: Medicare AWV >> Nov 16, 2021  1:10 PM Devoria Glassing wrote: Reason for CRM: Left message for patient to schedule Annual Wellness Visit.  Please schedule with Nurse Health Advisor Denisa O'Brien-Blaney, LPN at [facility]. This appt can be telephone or office visit.  Please call [phone] ask for [facility]

## 2021-11-19 ENCOUNTER — Ambulatory Visit
Admission: RE | Admit: 2021-11-19 | Discharge: 2021-11-19 | Disposition: A | Discharge status: Home / Self Care | Payer: Medicare Other | Source: Ambulatory Visit | Attending: Obstetrics and Gynecology | Admitting: Obstetrics and Gynecology

## 2021-11-19 DIAGNOSIS — R928 Other abnormal and inconclusive findings on diagnostic imaging of breast: Secondary | ICD-10-CM | POA: Diagnosis not present

## 2021-11-19 DIAGNOSIS — R921 Mammographic calcification found on diagnostic imaging of breast: Secondary | ICD-10-CM

## 2021-11-20 ENCOUNTER — Encounter: Payer: Self-pay | Admitting: Obstetrics and Gynecology

## 2021-11-22 ENCOUNTER — Telehealth: Payer: Self-pay

## 2021-11-22 NOTE — Telephone Encounter (Signed)
Pls call pt and tell her I'll call her tomorrow since I'm out today.

## 2021-11-22 NOTE — Telephone Encounter (Signed)
Pt aware.

## 2021-11-23 NOTE — Telephone Encounter (Signed)
Spoke with pt. Questions answered.

## 2021-12-27 ENCOUNTER — Encounter: Payer: Self-pay | Admitting: Family

## 2021-12-29 ENCOUNTER — Other Ambulatory Visit: Payer: Self-pay | Admitting: Family

## 2021-12-29 ENCOUNTER — Telehealth: Payer: Self-pay

## 2021-12-29 DIAGNOSIS — E785 Hyperlipidemia, unspecified: Secondary | ICD-10-CM

## 2021-12-29 MED ORDER — ROSUVASTATIN CALCIUM 5 MG PO TABS: 5.0000 mg | ORAL_TABLET | Freq: Every day | ORAL | 3 refills | Status: DC

## 2021-12-29 NOTE — Telephone Encounter (Signed)
Patient states we sent her refill to [facility], but it needs to go to [facility] in [place].  Patient states she removed [facility] from her pharmacy list via [place].

## 2021-12-30 ENCOUNTER — Other Ambulatory Visit: Payer: Self-pay

## 2021-12-30 DIAGNOSIS — E785 Hyperlipidemia, unspecified: Secondary | ICD-10-CM

## 2021-12-30 MED ORDER — ROSUVASTATIN CALCIUM 5 MG PO TABS: 5.0000 mg | ORAL_TABLET | Freq: Every day | ORAL | 3 refills | Status: DC

## 2021-12-30 NOTE — Telephone Encounter (Signed)
NOTED! Rx's sent to correct pharmacy.

## 2022-01-25 DIAGNOSIS — D2362 Other benign neoplasm of skin of left upper limb, including shoulder: Secondary | ICD-10-CM | POA: Diagnosis not present

## 2022-01-25 DIAGNOSIS — L988 Other specified disorders of the skin and subcutaneous tissue: Secondary | ICD-10-CM | POA: Diagnosis not present

## 2022-02-02 ENCOUNTER — Other Ambulatory Visit (INDEPENDENT_AMBULATORY_CARE_PROVIDER_SITE_OTHER): Payer: Medicare Other

## 2022-02-02 DIAGNOSIS — E785 Hyperlipidemia, unspecified: Secondary | ICD-10-CM

## 2022-02-02 LAB — COMPREHENSIVE METABOLIC PANEL
ALT: 12 U/L (ref 0–35)
AST: 18 U/L (ref 0–37)
Albumin: 4.5 g/dL (ref 3.5–5.2)
Alkaline Phosphatase: 79 U/L (ref 39–117)
BUN: 15 mg/dL (ref 6–23)
CO2: 30 mEq/L (ref 19–32)
Calcium: 9.3 mg/dL (ref 8.4–10.5)
Chloride: 102 mEq/L (ref 96–112)
Creatinine, Ser: 1.02 mg/dL (ref 0.40–1.20)
GFR: 57.33 mL/min — ABNORMAL LOW (ref >60.00)
Glucose, Bld: 89 mg/dL (ref 70–99)
Potassium: 4.1 mEq/L (ref 3.5–5.1)
Sodium: 141 mEq/L (ref 135–145)
Total Bilirubin: 0.6 mg/dL (ref 0.2–1.2)
Total Protein: 7.3 g/dL (ref 6.0–8.3)

## 2022-03-14 DIAGNOSIS — E785 Hyperlipidemia, unspecified: Secondary | ICD-10-CM | POA: Diagnosis not present

## 2022-03-14 DIAGNOSIS — I10 Essential (primary) hypertension: Secondary | ICD-10-CM | POA: Diagnosis not present

## 2022-03-15 ENCOUNTER — Other Ambulatory Visit: Payer: Self-pay | Admitting: Internal Medicine

## 2022-03-15 DIAGNOSIS — E785 Hyperlipidemia, unspecified: Secondary | ICD-10-CM

## 2022-03-15 DIAGNOSIS — I10 Essential (primary) hypertension: Secondary | ICD-10-CM

## 2022-03-17 ENCOUNTER — Telehealth: Payer: Self-pay | Admitting: Family

## 2022-03-17 NOTE — Telephone Encounter (Signed)
Contacted [name] to schedule their annual wellness visit. Appointment made for 03/25/2022.  Thank you,  Martelle Direct dial  [phone]

## 2022-03-21 ENCOUNTER — Ambulatory Visit
Admission: RE | Admit: 2022-03-21 | Discharge: 2022-03-21 | Disposition: A | Discharge status: Home / Self Care | Payer: Self-pay | Source: Ambulatory Visit | Attending: Internal Medicine | Admitting: Internal Medicine

## 2022-03-21 DIAGNOSIS — I10 Essential (primary) hypertension: Secondary | ICD-10-CM | POA: Insufficient documentation

## 2022-03-21 DIAGNOSIS — E785 Hyperlipidemia, unspecified: Secondary | ICD-10-CM | POA: Insufficient documentation

## 2022-03-25 ENCOUNTER — Ambulatory Visit (INDEPENDENT_AMBULATORY_CARE_PROVIDER_SITE_OTHER): Payer: Medicare Other

## 2022-03-25 VITALS — Ht 63.0 in | Wt 142.0 lb

## 2022-03-25 DIAGNOSIS — Z0000 Encounter for general adult medical examination without abnormal findings: Secondary | ICD-10-CM | POA: Diagnosis not present

## 2022-03-25 NOTE — Progress Notes (Signed)
Subjective:   Colleen Stephenson is a 67 y.o. female who presents for an Initial Medicare Annual Wellness Visit.  Review of Systems    No ROS.  Medicare Wellness Virtual Visit.  Visual/audio telehealth visit, UTA vital signs.   See social history for additional risk factors.   Cardiac Risk Factors include: advanced age (>22mn, >>53women)     Objective:    Today's Vitals   03/25/22 1433  Weight: 142 lb (64.4 kg)  Height: '5\' 3"'$  (1.6 m)   Body mass index is 25.15 kg/m.     03/25/2022    2:37 PM  Advanced Directives  Does Patient Have a Medical Advance Directive? No  Would patient like information on creating a medical advance directive? No - Patient declined    Current Medications (verified) Outpatient Encounter Medications as of 03/25/2022  Medication Sig   amLODipine (NORVASC) 2.5 MG tablet Take 1 tablet (2.5 mg total) by mouth daily.   Calcium Carb-Cholecalciferol (CALCIUM 600+D) 600-800 MG-UNIT TABS Take 1 tablet by mouth daily.   Cholecalciferol 25 MCG (1000 UT) tablet Take by mouth.   rosuvastatin (CRESTOR) 5 MG tablet Take 1 tablet (5 mg total) by mouth daily.   traZODone (DESYREL) 50 MG tablet Take 0.5-1 tablets (25-50 mg total) by mouth at bedtime as needed for sleep.   No facility-administered encounter medications on file as of 03/25/2022.    Allergies (verified) Amoxicillin-pot clavulanate   History: Past Medical History:  Diagnosis Date   Endometriosis    Hyperlipidemia    Osteopenia    Vitamin D deficiency    Past Surgical History:  Procedure Laterality Date   BREAST BIOPSY Right    excisional late 1990's   BREAST EXCISIONAL BIOPSY     COLONOSCOPY  2017   with Dr. EVira Agar repeat after 5 yrs   DIAGNOSTIC LAPAROSCOPY     TUBAL LIGATION     VAGINAL HYSTERECTOMY     Family History  Problem Relation Age of Onset   Stomach cancer Father    Breast cancer Brother 675      BRCA/panel neg   Breast cancer Cousin        not sure of age, not close with  relative   Bone cancer Neg Hx    Hypertension Neg Hx    Social History   Socioeconomic History   Marital status: Single    Spouse name: Not on file   Number of children: Not on file   Years of education: Not on file   Highest education level: Not on file  Occupational History   Not on file  Tobacco Use   Smoking status: Never   Smokeless tobacco: Never  Vaping Use   Vaping Use: Never used  Substance and Sexual Activity   Alcohol use: Yes    Comment: couple times of week wine or liquor.   Drug use: No   Sexual activity: Yes    Birth control/protection: Surgical    Comment: Hysterectomy  Other Topics Concern   Not on file  Social History Narrative   Retired - bTax adviser     Social Determinants of Health   Financial Resource Strain: [place] (03/25/2022)   Overall Financial Resource Strain (CARDIA)    Difficulty of Paying Living Expenses: Not hard at all  Food Insecurity: No Food Insecurity (03/25/2022)   Hunger Vital Sign    Worried About Running Out of Food in the Last Year: Never true    Ran Out  of Food in the Last Year: Never true  Transportation Needs: No Transportation Needs (03/25/2022)   PRAPARE - Hydrologist (Medical): No    Lack of Transportation (Non-Medical): No  Physical Activity: Sufficiently Active (03/25/2022)   Exercise Vital Sign    Days of Exercise per Week: 7 days    Minutes of Exercise per Session: 60 min  Stress: No Stress Concern Present (03/25/2022)   [place]    Feeling of Stress : Not at all  Social Connections: Unknown (03/25/2022)   Social Connection and Isolation Panel [NHANES]    Frequency of Communication with Friends and Family: More than three times a week    Frequency of Social Gatherings with Friends and Family: More than three times a week    Attends Religious Services: Not on Advertising copywriter or Organizations: Not  on file    Attends Archivist Meetings: Not on file    Marital Status: Not on file    Tobacco Counseling Counseling given: Not Answered   Clinical Intake:  Pre-visit preparation completed: Yes        Diabetes: No  How often do you need to have someone help you when you read instructions, pamphlets, or other written materials from your doctor or pharmacy?: 1 - Never    Interpreter Needed?: No      Activities of Daily Living    03/25/2022    2:34 PM  In your present state of health, do you have any difficulty performing the following activities:  Hearing? 0  Vision? 0  Difficulty concentrating or making decisions? 0  Walking or climbing stairs? 0  Dressing or bathing? 0  Doing errands, shopping? 0  Preparing Food and eating ? N  Using the Toilet? N  In the past six months, have you accidently leaked urine? N  Do you have problems with loss of bowel control? N  Managing your Medications? N  Managing your Finances? N  Housekeeping or managing your Housekeeping? N    Patient Care Team: Burnard Hawthorne, FNP as PCP - General (Family Medicine)  Indicate any recent Medical Services you may have received from other than [facility] providers in the past year (date may be approximate).     Assessment:   This is a routine wellness examination for Colleen Stephenson.  I connected with  Colleen Stephenson on 03/25/22 by a audio enabled telemedicine application and verified that I am speaking with the correct person using two identifiers.  Patient Location: Home  Provider Location: Home Office  I discussed the limitations of evaluation and management by telemedicine. The patient expressed understanding and agreed to proceed.   Hearing/Vision screen Hearing Screening - Comments:: Patient is able to hear conversational tones without difficulty.  No issues reported.   Vision Screening - Comments:: Followed [facility], Dr. Jeni Salles No glasses worn Yearly eye  exams   Dietary issues and exercise activities discussed: Current Exercise Habits: Home exercise routine, Type of exercise: walking;yoga, Time (Minutes): > 60, Frequency (Times/Week): 7, Weekly Exercise (Minutes/Week): 0, Intensity: Moderate   Goals Addressed             This Visit's Progress    Maintain healthy lifestyle       Stay active Healthy diet Good water intake       Depression Screen    03/25/2022    2:34 PM 11/01/2021   10:05  AM 06/18/2020    4:30 PM 09/23/2019    3:14 PM  PHQ 2/9 Scores  PHQ - 2 Score 0 0 0 0  PHQ- 9 Score    5    Fall Risk    03/25/2022    2:36 PM 11/01/2021   10:05 AM 06/18/2020    4:30 PM 09/23/2019    2:51 PM  Fall Risk   Falls in the past year? 0 0 0 0  Number falls in past yr: 0 0 0   Injury with Fall? 0 0 0   Risk for fall due to :  No Fall Risks    Follow up Falls evaluation completed;Falls prevention discussed Falls evaluation completed Falls evaluation completed Falls evaluation completed    FALL RISK PREVENTION PERTAINING TO THE HOME: Home free of loose throw rugs in walkways, pet beds, electrical cords, etc? Yes  Adequate lighting in your home to reduce risk of falls? Yes   ASSISTIVE DEVICES UTILIZED TO PREVENT FALLS: Life alert? No  Use of a cane, walker or w/c? No   TIMED UP AND GO: Was the test performed? No .    Cognitive Function:  Patient is alert and oriented x3.  100% independent Manages her own medications and finances      03/25/2022    3:02 PM  6CIT Screen  What Year? 0 points  What month? 0 points  What time? 0 points    Immunizations Immunization History  Administered Date(s) Administered   PFIZER(Purple Top)SARS-COV-2 Vaccination 08/27/2019, 09/18/2019   TDAP status: Due, Education has been provided regarding the importance of this vaccine. Advised may receive this vaccine at local pharmacy or Health Dept. Aware to provide a copy of the vaccination record if obtained from local pharmacy or Health  Dept. Verbalized acceptance and understanding.  Shingles vaccine- reports immunizations completed. Update for immunization record needed.   Screening Tests Health Maintenance  Topic Date Due   DTaP/Tdap/Td (1 - Tdap) Never done   COLONOSCOPY (Pts 45-66yr Insurance coverage will need to be confirmed)  01/25/2020   INFLUENZA VACCINE  04/24/2022 (Originally 08/24/2021)   Zoster Vaccines- Shingrix (1 of 2) 06/25/2022 (Originally 08/04/2005)   Pneumonia Vaccine 67 Years old (1 of 1 - PCV) 11/02/2022 (Originally 08/04/2020)   COVID-19 Vaccine (3 - 2023-24 season) 11/02/2022 (Originally 09/24/2021)   Medicare Annual Wellness (AWV)  03/25/2023   MAMMOGRAM  10/27/2023   DEXA SCAN  Completed   HPV VACCINES  Aged Out   Hepatitis C Screening  Discontinued    Health Maintenance Health Maintenance Due  Topic Date Due   DTaP/Tdap/Td (1 - Tdap) Never done   COLONOSCOPY (Pts 45-445yrInsurance coverage will need to be confirmed)  01/25/2020    Colonoscopy- scheduled 04/2022.   Lung Cancer Screening: (Low Dose CT Chest recommended if Age 67-80ears, 30 pack-year currently smoking OR have quit w/in 15years.) does not qualify.   Hepatitis C Screening: discontinued.   Vision Screening: Recommended annual ophthalmology exams for early detection of glaucoma and other disorders of the eye.  Dental Screening: Recommended annual dental exams for proper oral hygiene  Community Resource Referral / Chronic Care Management: CRR required this visit?  No   CCM required this visit?  No      Plan:     I have personally reviewed and noted the following in the patient's chart:   Medical and social history Use of alcohol, tobacco or illicit drugs  Current medications and supplements including opioid prescriptions. Patient is  not currently taking opioid prescriptions. Functional ability and status Nutritional status Physical activity Advanced directives List of other physicians Hospitalizations,  surgeries, and ER visits in previous 12 months Vitals Screenings to include cognitive, depression, and falls Referrals and appointments  In addition, I have reviewed and discussed with patient certain preventive protocols, quality metrics, and best practice recommendations. A written personalized care plan for preventive services as well as general preventive health recommendations were provided to patient.     Leta Jungling, LPN   [id number]

## 2022-03-25 NOTE — Patient Instructions (Addendum)
[name] , Thank you for taking time to come for your Medicare Wellness Visit. I appreciate your ongoing commitment to your health goals. Please review the following plan we discussed and let me know if I can assist you in the future.   These are the goals we discussed:  Goals      Maintain healthy lifestyle     Stay active Healthy diet Good water intake        This is a list of the screening recommended for you and due dates:  Health Maintenance  Topic Date Due   DTaP/Tdap/Td vaccine (1 - Tdap) Never done   Colon Cancer Screening  01/25/2020   Flu Shot  04/24/2022*   Zoster (Shingles) Vaccine (1 of 2) 06/25/2022*   Pneumonia Vaccine (1 of 1 - PCV) 11/02/2022*   COVID-19 Vaccine (3 - 2023-24 season) 11/02/2022*   Medicare Annual Wellness Visit  03/25/2023   Mammogram  10/27/2023   DEXA scan (bone density measurement)  Completed   HPV Vaccine  Aged Out   Hepatitis C Screening: USPSTF Recommendation to screen - Ages 18-79 yo.  Discontinued  *Topic was postponed. The date shown is not the original due date.   Conditions/risks identified: none new  Next appointment: Follow up in one year for your annual wellness visit    Preventive Care 65 Years and Older, Female Preventive care refers to lifestyle choices and visits with your health care provider that can promote health and wellness. What does preventive care include? A yearly physical exam. This is also called an annual well check. Dental exams once or twice a year. Routine eye exams. Ask your health care provider how often you should have your eyes checked. Personal lifestyle choices, including: Daily care of your teeth and gums. Regular physical activity. Eating a healthy diet. Avoiding tobacco and drug use. Limiting alcohol use. Practicing safe sex. Taking low-dose aspirin every day. Taking vitamin and mineral supplements as recommended by your health care provider. What happens during an annual well check? The  services and screenings done by your health care provider during your annual well check will depend on your age, overall health, lifestyle risk factors, and family history of disease. Counseling  Your health care provider may ask you questions about your: Alcohol use. Tobacco use. Drug use. Emotional well-being. Home and relationship well-being. Sexual activity. Eating habits. History of falls. Memory and ability to understand (cognition). Work and work Statistician. Reproductive health. Screening  You may have the following tests or measurements: Height, weight, and BMI. Blood pressure. Lipid and cholesterol levels. These may be checked every 5 years, or more frequently if you are over 56 years old. Skin check. Lung cancer screening. You may have this screening every year starting at age 35 if you have a 30-pack-year history of smoking and currently smoke or have quit within the past 15 years. Fecal occult blood test (FOBT) of the stool. You may have this test every year starting at age 3. Flexible sigmoidoscopy or colonoscopy. You may have a sigmoidoscopy every 5 years or a colonoscopy every 10 years starting at age 10. Hepatitis C blood test. Hepatitis B blood test. Sexually transmitted disease (STD) testing. Diabetes screening. This is done by checking your blood sugar (glucose) after you have not eaten for a while (fasting). You may have this done every 1-3 years. Bone density scan. This is done to screen for osteoporosis. You may have this done starting at age 74. Mammogram. This may be done every  1-2 years. Talk to your health care provider about how often you should have regular mammograms. Talk with your health care provider about your test results, treatment options, and if necessary, the need for more tests. Vaccines  Your health care provider may recommend certain vaccines, such as: Influenza vaccine. This is recommended every year. Tetanus, diphtheria, and acellular  pertussis (Tdap, Td) vaccine. You may need a Td booster every 10 years. Zoster vaccine. You may need this after age 41. Pneumococcal 13-valent conjugate (PCV13) vaccine. One dose is recommended after age 54. Pneumococcal polysaccharide (PPSV23) vaccine. One dose is recommended after age 83. Talk to your health care provider about which screenings and vaccines you need and how often you need them. This information is not intended to replace advice given to you by your health care provider. Make sure you discuss any questions you have with your health care provider. Document Released: 02/06/2015 Document Revised: 09/30/2015 Document Reviewed: 11/11/2014 Elsevier Interactive Patient Education  2017 [employer] Prevention in the Home Falls can cause injuries. They can happen to people of all ages. There are many things you can do to make your home safe and to help prevent falls. What can I do on the outside of my home? Regularly fix the edges of walkways and driveways and fix any cracks. Remove anything that might make you trip as you walk through a door, such as a raised step or threshold. Trim any bushes or trees on the path to your home. Use bright outdoor lighting. Clear any walking paths of anything that might make someone trip, such as rocks or tools. Regularly check to see if handrails are loose or broken. Make sure that both sides of any steps have handrails. Any raised decks and porches should have guardrails on the edges. Have any leaves, snow, or ice cleared regularly. Use sand or salt on walking paths during winter. Clean up any spills in your garage right away. This includes oil or grease spills. What can I do in the bathroom? Use night lights. Install grab bars by the toilet and in the tub and shower. Do not use towel bars as grab bars. Use non-skid mats or decals in the tub or shower. If you need to sit down in the shower, use a plastic, non-slip stool. Keep the floor  dry. Clean up any water that spills on the floor as soon as it happens. Remove soap buildup in the tub or shower regularly. Attach bath mats securely with double-sided non-slip rug tape. Do not have throw rugs and other things on the floor that can make you trip. What can I do in the bedroom? Use night lights. Make sure that you have a light by your bed that is easy to reach. Do not use any sheets or blankets that are too big for your bed. They should not hang down onto the floor. Have a firm chair that has side arms. You can use this for support while you get dressed. Do not have throw rugs and other things on the floor that can make you trip. What can I do in the kitchen? Clean up any spills right away. Avoid walking on wet floors. Keep items that you use a lot in easy-to-reach places. If you need to reach something above you, use a strong step stool that has a grab bar. Keep electrical cords out of the way. Do not use floor polish or wax that makes floors slippery. If you must use wax, use non-skid  floor wax. Do not have throw rugs and other things on the floor that can make you trip. What can I do with my stairs? Do not leave any items on the stairs. Make sure that there are handrails on both sides of the stairs and use them. Fix handrails that are broken or loose. Make sure that handrails are as long as the stairways. Check any carpeting to make sure that it is firmly attached to the stairs. Fix any carpet that is loose or worn. Avoid having throw rugs at the top or bottom of the stairs. If you do have throw rugs, attach them to the floor with carpet tape. Make sure that you have a light switch at the top of the stairs and the bottom of the stairs. If you do not have them, ask someone to add them for you. What else can I do to help prevent falls? Wear shoes that: Do not have high heels. Have rubber bottoms. Are comfortable and fit you well. Are closed at the toe. Do not wear  sandals. If you use a stepladder: Make sure that it is fully opened. Do not climb a closed stepladder. Make sure that both sides of the stepladder are locked into place. Ask someone to hold it for you, if possible. Clearly mark and make sure that you can see: Any grab bars or handrails. First and last steps. Where the edge of each step is. Use tools that help you move around (mobility aids) if they are needed. These include: Canes. Walkers. Scooters. Crutches. Turn on the lights when you go into a dark area. Replace any light bulbs as soon as they burn out. Set up your furniture so you have a clear path. Avoid moving your furniture around. If any of your floors are uneven, fix them. If there are any pets around you, be aware of where they are. Review your medicines with your doctor. Some medicines can make you feel dizzy. This can increase your chance of falling. Ask your doctor what other things that you can do to help prevent falls. This information is not intended to replace advice given to you by your health care provider. Make sure you discuss any questions you have with your health care provider. Document Released: 11/06/2008 Document Revised: 06/18/2015 Document Reviewed: 02/14/2014 Elsevier Interactive Patient Education  2017 [employer].

## 2022-04-14 ENCOUNTER — Emergency Department: Payer: Medicare Other

## 2022-04-14 ENCOUNTER — Emergency Department
Admission: EM | Admit: 2022-04-14 | Discharge: 2022-04-14 | Disposition: A | Discharge status: Home / Self Care | Payer: Medicare Other | Attending: Emergency Medicine | Admitting: Emergency Medicine

## 2022-04-14 ENCOUNTER — Telehealth: Payer: Self-pay | Admitting: Family

## 2022-04-14 DIAGNOSIS — I10 Essential (primary) hypertension: Secondary | ICD-10-CM | POA: Diagnosis not present

## 2022-04-14 DIAGNOSIS — R42 Dizziness and giddiness: Secondary | ICD-10-CM | POA: Insufficient documentation

## 2022-04-14 LAB — CBC WITH DIFFERENTIAL/PLATELET
Abs Immature Granulocytes: 0.01 10*3/uL (ref 0.00–0.07)
Basophils Absolute: 0 10*3/uL (ref 0.0–0.1)
Basophils Relative: 1 %
Eosinophils Absolute: 0.1 10*3/uL (ref 0.0–0.5)
Eosinophils Relative: 1 %
HCT: 45.3 % (ref 36.0–46.0)
Hemoglobin: 15 g/dL (ref 12.0–15.0)
Immature Granulocytes: 0 %
Lymphocytes Relative: 18 %
Lymphs Abs: 1.2 10*3/uL (ref 0.7–4.0)
MCH: 31 pg (ref 26.0–34.0)
MCHC: 33.1 g/dL (ref 30.0–36.0)
MCV: 93.6 fL (ref 80.0–100.0)
Monocytes Absolute: 0.3 10*3/uL (ref 0.1–1.0)
Monocytes Relative: 5 %
Neutro Abs: 4.9 10*3/uL (ref 1.7–7.7)
Neutrophils Relative %: 75 %
Platelets: 227 10*3/uL (ref 150–400)
RBC: 4.84 MIL/uL (ref 3.87–5.11)
RDW: 12.4 % (ref 11.5–15.5)
WBC: 6.6 10*3/uL (ref 4.0–10.5)
nRBC: 0 % (ref 0.0–0.2)

## 2022-04-14 LAB — COMPREHENSIVE METABOLIC PANEL
ALT: 15 U/L (ref 0–44)
AST: 23 U/L (ref 15–41)
Albumin: 5 g/dL (ref 3.5–5.0)
Alkaline Phosphatase: 73 U/L (ref 38–126)
Anion gap: 8 (ref 5–15)
BUN: 16 mg/dL (ref 8–23)
CO2: 27 mmol/L (ref 22–32)
Calcium: 9.8 mg/dL (ref 8.9–10.3)
Chloride: 101 mmol/L (ref 98–111)
Creatinine, Ser: 0.97 mg/dL (ref 0.44–1.00)
GFR, Estimated: >60 mL/min (ref >60)
Glucose, Bld: 137 mg/dL — ABNORMAL HIGH (ref 70–99)
Potassium: 4.1 mmol/L (ref 3.5–5.1)
Sodium: 136 mmol/L (ref 135–145)
Total Bilirubin: 0.9 mg/dL (ref 0.3–1.2)
Total Protein: 8.4 g/dL — ABNORMAL HIGH (ref 6.5–8.1)

## 2022-04-14 LAB — TROPONIN I (HIGH SENSITIVITY): Troponin I (High Sensitivity): 2 ng/L (ref <18)

## 2022-04-14 MED ORDER — MECLIZINE HCL 25 MG PO TABS
25.0000 mg | ORAL_TABLET | Freq: Once | ORAL | Status: AC
  Administered 2022-04-14: 25 mg via ORAL
  Filled 2022-04-14: qty 1

## 2022-04-14 MED ORDER — DIAZEPAM 5 MG/ML IJ SOLN
5.0000 mg | Freq: Once | INTRAMUSCULAR | Status: AC
  Administered 2022-04-14: 5 mg via INTRAVENOUS
  Filled 2022-04-14: qty 2

## 2022-04-14 MED ORDER — MECLIZINE HCL 50 MG PO TABS: ORAL_TABLET | ORAL | 0 refills | Status: DC

## 2022-04-14 NOTE — ED Triage Notes (Signed)
Pt sts that she has been dizzy for months and she attempted to make an appointment with her PCP and he advised her to come to the ED

## 2022-04-14 NOTE — Telephone Encounter (Signed)
Pt sent this message and as of right now she is in the ED but she has appt scheduled with you tomorrow 04/15/22

## 2022-04-14 NOTE — Telephone Encounter (Signed)
  Background  Back issues for entire life. Today Balance issues.    Symptoms: Back issues are worse the pass 2 weeks, morning seems to be the worse and gets better over time. Today patient is having balance issues and back hurting.     Attributing factors (medication changes, positional changes, etc. )      Duration :   Pain Scale?  On 1-10 how woiuld you rate your pain? What makes it better or worse?    Blood pressure            Pulse             Temp

## 2022-04-14 NOTE — ED Provider Notes (Signed)
[facility] Provider Note    Event Date/Time   First MD Initiated Contact with Patient 04/14/22 1441     (approximate)   History   Dizziness   HPI  Colleen Stephenson is a 67 y.o. female with history of hypertension, hyperlipidemia and vitamin D deficiency presents emergency department complaining of dizziness on and off for months but has worsened over the last 2 days.  States she is dizzy every time she stands up.  She did call her PCP they advised her to come to the ED.  She states it usually goes away within 20 minutes but today it has lasted all day.      Physical Exam   Triage Vital Signs: ED Triage Vitals [04/14/22 1335]  Enc Vitals Group     BP (!) 192/81     Pulse Rate 72     Resp 17     Temp 98 F (36.7 C)     Temp Source Oral     SpO2 98 %     Weight 145 lb (65.8 kg)     Height      Head Circumference      Peak Flow      Pain Score 0     Pain Loc      Pain Edu?      Excl. in [place]?     Most recent vital signs: Vitals:   04/14/22 1335  BP: (!) 192/81  Pulse: 72  Resp: 17  Temp: 98 F (36.7 C)  SpO2: 98%     General: Awake, no distress.   CV:  Good peripheral perfusion. regular rate and  rhythm Resp:  Normal effort. Lungs cta Abd:  No distention.   Other:  Pronator drift noted upon standing, neuro is grossly intact   ED Results / Procedures / Treatments   Labs (all labs ordered are listed, but only abnormal results are displayed) Labs Reviewed  COMPREHENSIVE METABOLIC PANEL - Abnormal; Notable for the following components:      Result Value   Glucose, Bld 137 (*)    Total Protein 8.4 (*)    All other components within normal limits  CBC WITH DIFFERENTIAL/PLATELET  TROPONIN I (HIGH SENSITIVITY)  TROPONIN I (HIGH SENSITIVITY)     EKG     RADIOLOGY CT of the head, MRI of the brain    PROCEDURES:   Procedures   MEDICATIONS ORDERED IN ED: Medications  meclizine (ANTIVERT) tablet 25 mg (25 mg Oral  Given 04/14/22 1558)  diazepam (VALIUM) injection 5 mg (5 mg Intravenous Given 04/14/22 1607)     IMPRESSION / MDM / ASSESSMENT AND PLAN / ED COURSE  I reviewed the triage vital signs and the nursing notes.                              Differential diagnosis includes, but is not limited to, CVA, brain tumor, vertigo, positional dizziness  Patient's presentation is most consistent with acute life-threatening illness  Patient's labs are reassuring  CT of the head was independently reviewed and interpreted by me as being negative for acute abnormality  MRI of the brain, I did review the radiologist report, I interpret this as being negative for any acute abnormality  I did explain the findings to the patient.  She is feeling a little better after the Antivert and Valium.  Will start her on a prescription for Antivert.  I did  encourage her to follow-up with her regular doctor to see if the dizziness could be associated with her blood pressure medication.  At this time I do not want to alter her medication as she does have a PCP.  Patient is in agreement treatment plan.  Strict instructions to return if worsening.      FINAL CLINICAL IMPRESSION(S) / ED DIAGNOSES   Final diagnoses:  Dizziness     Rx / DC Orders   ED Discharge Orders          Ordered    meclizine (ANTIVERT) 50 MG tablet        04/14/22 1718             Note:  This document was prepared using Dragon voice recognition software and may include unintentional dictation errors.    Versie Starks, PA-C 04/14/22 Marshell Garfinkel, MD 04/14/22 [phone]

## 2022-04-15 ENCOUNTER — Ambulatory Visit (INDEPENDENT_AMBULATORY_CARE_PROVIDER_SITE_OTHER): Payer: Medicare Other | Admitting: Family

## 2022-04-15 ENCOUNTER — Encounter: Payer: Self-pay | Admitting: Family

## 2022-04-15 ENCOUNTER — Other Ambulatory Visit: Payer: Self-pay | Admitting: Family

## 2022-04-15 VITALS — BP 136/86 | HR 64 | Temp 98.2°F | Ht 64.0 in | Wt 143.4 lb

## 2022-04-15 DIAGNOSIS — G4700 Insomnia, unspecified: Secondary | ICD-10-CM

## 2022-04-15 DIAGNOSIS — K7689 Other specified diseases of liver: Secondary | ICD-10-CM | POA: Diagnosis not present

## 2022-04-15 DIAGNOSIS — M545 Low back pain, unspecified: Secondary | ICD-10-CM | POA: Diagnosis not present

## 2022-04-15 DIAGNOSIS — G8929 Other chronic pain: Secondary | ICD-10-CM

## 2022-04-15 DIAGNOSIS — R42 Dizziness and giddiness: Secondary | ICD-10-CM | POA: Diagnosis not present

## 2022-04-15 DIAGNOSIS — F419 Anxiety disorder, unspecified: Secondary | ICD-10-CM

## 2022-04-15 MED ORDER — CYCLOBENZAPRINE HCL 10 MG PO TABS: 10.0000 mg | ORAL_TABLET | Freq: Three times a day (TID) | ORAL | 0 refills | Status: DC | PRN

## 2022-04-15 NOTE — Assessment & Plan Note (Addendum)
Incidental finding on CT calcium score; I advised dedicated right upper quadrant ultrasound to evaluate the liver in its entirety.  She politely declines at this time and will let me know when she would like for me to order.

## 2022-04-15 NOTE — Progress Notes (Signed)
Assessment & Plan:  Chronic right-sided low back pain without sciatica Assessment & Plan: No radicular symptoms or alarm features at this time.  She politely declines baseline lumbar x-ray.  We agreed likely she has DDD. Symptoms may be aggravated by muscle spasm and SI joint dysfunction.  She prefers to start with muscle relaxant, physical therapy.  Provided her with home exercises as well.  Counseled on how to use and schedule Tylenol arthritis.  Orders: -     Cyclobenzaprine HCl; Take 1 tablet (10 mg total) by mouth 3 (three) times daily as needed for muscle spasms.  Dispense: 30 tablet; Refill: 0 -     Ambulatory referral to Physical Therapy  Dizziness Assessment & Plan: Reviewed emergency room visit yesterday.  Reassuring CT head, MRI brain. No carotid bruits on exam.  Patient does has chronic tinnitus, hearing loss.   question if BPPV or meniere's disease.  She has been started on Antivert.  We discussed amlodipine and if aggravating.  She is not orthostatic . We discussed monitoring BP at home and consideration for changing to another BP medication to see if possibly aggregated by amlodipine.  Consider ENT consult. Close f/u/    Liver cyst Assessment & Plan: Incidental finding on CT calcium score; I advised dedicated right upper quadrant ultrasound to evaluate the liver in its entirety.  She politely declines at this time and will let me know when she would like for me to order.       Return precautions given.   Risks, benefits, and alternatives of the medications and treatment plan prescribed today were discussed, and patient expressed understanding.   Education regarding symptom management and diagnosis given to patient on AVS either electronically or printed.  Return in about 1 month (around 05/16/2022).  Colleen Paris, FNP  Subjective:    Patient ID: Colleen Stephenson, female    DOB: 04-19-1955, 67 y.o.   MRN: [id number]  CC: Colleen Stephenson is a 67 y.o. female who  presents today for an acute visit.    HPI: She complains of back pain  Complains of dizziness for months. She describes this as 'off balance'.  She describes an episode in which she was sitting a table and fell over as room was moving.  No cough, congestion Chronic hearing loss.  Chronic tinnitus, it is not pulsatile. No dizziness with standing.  This was self limited. It was usually in the morning.   She is not sure if r/t amlodipine.   She copmlains of low back pain, 2 weeks, waxes and waned. It was better and then 6 days ago she walked dog and she bent over to pick up basket with wood and she could feel pain recur.  She reports while exercising and she was carrying a weight in arm. She could feel the pain as 'soon as it happened'.  No saddle anesthesia, numbness, urinary or stool incontinence  Exercise with relief   No h/o cancer.      Presents emergency room yesterday for dizziness.  Dizziness has been present for months however the last couple days worse.  Dizzy every time she stands up.  CT of the head negative for acute findings.  MRI brain without acute infarct or intracranial hemorrhage.  No mass.  Prescribed Antivert Allergies: Amoxicillin-pot clavulanate Current Outpatient Medications on File Prior to Visit  Medication Sig Dispense Refill   amLODipine (NORVASC) 2.5 MG tablet Take 1 tablet (2.5 mg total) by mouth daily. 90 tablet 3  Calcium Carb-Cholecalciferol (CALCIUM 600+D) 600-800 MG-UNIT TABS Take 1 tablet by mouth daily.     meclizine (ANTIVERT) 50 MG tablet Take 1/2 or 1 pills 3 times daily as needed for dizziness 30 tablet 0   rosuvastatin (CRESTOR) 5 MG tablet Take 1 tablet (5 mg total) by mouth daily. 90 tablet 3   traZODone (DESYREL) 50 MG tablet Take 0.5-1 tablets (25-50 mg total) by mouth at bedtime as needed for sleep. 30 tablet 2   No current facility-administered medications on file prior to visit.    Review of Systems  Constitutional:  Negative  for chills and fever.  Respiratory:  Negative for cough.   Cardiovascular:  Negative for chest pain and palpitations.  Gastrointestinal:  Negative for nausea and vomiting.      Objective:    BP 136/86   Pulse 64   Temp 98.2 F (36.8 C) (Oral)   Ht 5\' 4"  (1.626 m)   Wt 143 lb 6.4 oz (65 kg)   SpO2 97%   BMI 24.61 kg/m   BP Readings from Last 3 Encounters:  04/15/22 136/86  04/14/22 (!) 143/70  11/01/21 138/88   Wt Readings from Last 3 Encounters:  04/15/22 143 lb 6.4 oz (65 kg)  04/14/22 145 lb (65.8 kg)  03/25/22 142 lb (64.4 kg)    Physical Exam Vitals reviewed.  Constitutional:      Appearance: She is well-developed.  Eyes:     Conjunctiva/sclera: Conjunctivae normal.  Neck:     Vascular: No carotid bruit.  Cardiovascular:     Rate and Rhythm: Normal rate and regular rhythm.     Pulses: Normal pulses.     Heart sounds: Normal heart sounds.  Pulmonary:     Effort: Pulmonary effort is normal.     Breath sounds: Normal breath sounds. No wheezing, rhonchi or rales.  Musculoskeletal:       Arms:     Lumbar back: No swelling, edema, spasms, tenderness or bony tenderness. Normal range of motion.     Comments: Full range of motion with flexion, tension, lateral side bends. No bony tenderness. Right sided tenderness over si joint.  No pain, numbness, tingling elicited with single leg raise bilaterally.   Skin:    General: Skin is warm and dry.  Neurological:     Mental Status: She is alert.     Sensory: No sensory deficit.     Deep Tendon Reflexes:     Reflex Scores:      Patellar reflexes are 2+ on the right side and 2+ on the left side.    Comments: Sensation and strength intact bilateral lower extremities.  Psychiatric:        Speech: Speech normal.        Behavior: Behavior normal.        Thought Content: Thought content normal.

## 2022-04-15 NOTE — Assessment & Plan Note (Addendum)
No radicular symptoms or alarm features at this time.  She politely declines baseline lumbar x-ray.  We agreed likely she has DDD. Symptoms may be aggravated by muscle spasm and SI joint dysfunction.  She prefers to start with muscle relaxant, physical therapy.  Provided her with home exercises as well.  Counseled on how to use and schedule Tylenol arthritis.

## 2022-04-15 NOTE — Patient Instructions (Addendum)
Please let me know when I can order liver ultrasound.   start Antivert and document if this is helpful for vertigo.  Please monitor blood pressure at home and start a diary regards to times if you feel dizzy and its relationship with amlodipine, position and activity.  As we agreed to limit use of nsaids.  I have sent in Flexeril which is a muscle relaxant for you to try  Do not drive or operate heavy machinery while on muscle relaxant. Please do not drink alcohol. Only take this medication as needed for acute muscle spasm at bedtime. This medication make you feel drowsy so be very careful.  Stop taking if become too drowsy or somnolent as this puts you at risk for falls. Please contact our office with any questions.    As discussed, let's start by scheduling Tylenol Arthritis which is a 650mg  tablet .   You may take 1-2 tablets every 8 hours ( scheduled) with maximum of 6 tablets per day. Most adults can safely take 4 pills total per day of Tylenol Arthritis 650mg  tablet. Do not exceed 6 tablets a day of Tylenol Arthritis 650mg  tablet   For example , you could take two tablets in the morning ( 8am) and then two tablets again at 4pm.   Maximum daily dose of acetaminophen 4 g per day from all sources.  If you are taking another medication which includes acetaminophen (Tylenol) which may be in cough and cold preparations or pain medication such as Percocet, you will need to factor that into your total daily dose to be safe.  Please let me know if any questions  A great article regarding how to safely take and dose tylenol found below.  Title : 'Acetaminophen safety: Be cautious but not afraid'  https://www.health.http://www.walter.org/  Low Back Sprain or Strain Rehab Ask your health care provider which exercises are safe for you. Do exercises exactly as told by your health care provider and adjust them as directed. It is normal to feel mild stretching,  pulling, tightness, or discomfort as you do these exercises. Stop right away if you feel sudden pain or your pain gets worse. Do not begin these exercises until told by your health care provider. Stretching and range-of-motion exercises These exercises warm up your muscles and joints and improve the movement and flexibility of your back. These exercises also help to relieve pain, numbness, and tingling. Lumbar rotation  Lie on your back on a firm bed or the floor with your knees bent. Straighten your arms out to your sides so each arm forms a 90-degree angle (right angle) with a side of your body. Slowly move (rotate) both of your knees to one side of your body until you feel a stretch in your lower back (lumbar). Try not to let your shoulders lift off the floor. Hold this position for __________ seconds. Tense your abdominal muscles and slowly move your knees back to the starting position. Repeat this exercise on the other side of your body. Repeat __________ times. Complete this exercise __________ times a day. Single knee to chest  Lie on your back on a firm bed or the floor with both legs straight. Bend one of your knees. Use your hands to move your knee up toward your chest until you feel a gentle stretch in your lower back and buttock. Hold your leg in this position by holding on to the front of your knee. Keep your other leg as straight as possible. Hold this position  for __________ seconds. Slowly return to the starting position. Repeat with your other leg. Repeat __________ times. Complete this exercise __________ times a day. Prone extension on elbows  Lie on your abdomen on a firm bed or the floor (prone position). Prop yourself up on your elbows. Use your arms to help lift your chest up until you feel a gentle stretch in your abdomen and your lower back. This will place some of your body weight on your elbows. If this is uncomfortable, try stacking pillows under your  chest. Your hips should stay down, against the surface that you are lying on. Keep your hip and back muscles relaxed. Hold this position for __________ seconds. Slowly relax your upper body and return to the starting position. Repeat __________ times. Complete this exercise __________ times a day. Strengthening exercises These exercises build strength and endurance in your back. Endurance is the ability to use your muscles for a long time, even after they get tired. Pelvic tilt This exercise strengthens the muscles that lie deep in the abdomen. Lie on your back on a firm bed or the floor with your legs extended. Bend your knees so they are pointing toward the ceiling and your feet are flat on the floor. Tighten your lower abdominal muscles to press your lower back against the floor. This motion will tilt your pelvis so your tailbone points up toward the ceiling instead of pointing to your feet or the floor. To help with this exercise, you may place a small towel under your lower back and try to push your back into the towel. Hold this position for __________ seconds. Let your muscles relax completely before you repeat this exercise. Repeat __________ times. Complete this exercise __________ times a day. Alternating arm and leg raises  Get on your hands and knees on a firm surface. If you are on a hard floor, you may want to use padding, such as an exercise mat, to cushion your knees. Line up your arms and legs. Your hands should be directly below your shoulders, and your knees should be directly below your hips. Lift your left leg behind you. At the same time, raise your right arm and straighten it in front of you. Do not lift your leg higher than your hip. Do not lift your arm higher than your shoulder. Keep your abdominal and back muscles tight. Keep your hips facing the ground. Do not arch your back. Keep your balance carefully, and do not hold your breath. Hold this position for  __________ seconds. Slowly return to the starting position. Repeat with your right leg and your left arm. Repeat __________ times. Complete this exercise __________ times a day. Abdominal set with straight leg raise  Lie on your back on a firm bed or the floor. Bend one of your knees and keep your other leg straight. Tense your abdominal muscles and lift your straight leg up, 4-6 inches (10-15 cm) off the ground. Keep your abdominal muscles tight and hold this position for __________ seconds. Do not hold your breath. Do not arch your back. Keep it flat against the ground. Keep your abdominal muscles tense as you slowly lower your leg back to the starting position. Repeat with your other leg. Repeat __________ times. Complete this exercise __________ times a day. Single leg lower with bent knees Lie on your back on a firm bed or the floor. Tense your abdominal muscles and lift your feet off the floor, one foot at a time, so your knees  and hips are bent in 90-degree angles (right angles). Your knees should be over your hips and your lower legs should be parallel to the floor. Keeping your abdominal muscles tense and your knee bent, slowly lower one of your legs so your toe touches the ground. Lift your leg back up to return to the starting position. Do not hold your breath. Do not let your back arch. Keep your back flat against the ground. Repeat with your other leg. Repeat __________ times. Complete this exercise __________ times a day. Posture and body mechanics Good posture and healthy body mechanics can help to relieve stress in your body's tissues and joints. Body mechanics refers to the movements and positions of your body while you do your daily activities. Posture is part of body mechanics. Good posture means: Your spine is in its natural S-curve position (neutral). Your shoulders are pulled back slightly. Your head is not tipped forward (neutral). Follow these guidelines to  improve your posture and body mechanics in your everyday activities. Standing  When standing, keep your spine neutral and your feet about hip-width apart. Keep a slight bend in your knees. Your ears, shoulders, and hips should line up. When you do a task in which you stand in one place for a long time, place one foot up on a stable object that is 2-4 inches (5-10 cm) high, such as a footstool. This helps keep your spine neutral. Sitting  When sitting, keep your spine neutral and keep your feet flat on the floor. Use a footrest, if necessary, and keep your thighs parallel to the floor. Avoid rounding your shoulders, and avoid tilting your head forward. When working at a desk or a computer, keep your desk at a height where your hands are slightly lower than your elbows. Slide your chair under your desk so you are close enough to maintain good posture. When working at a computer, place your monitor at a height where you are looking straight ahead and you do not have to tilt your head forward or downward to look at the screen. Resting When lying down and resting, avoid positions that are most painful for you. If you have pain with activities such as sitting, bending, stooping, or squatting, lie in a position in which your body does not bend very much. For example, avoid curling up on your side with your arms and knees near your chest (fetal position). If you have pain with activities such as standing for a long time or reaching with your arms, lie with your spine in a neutral position and bend your knees slightly. Try the following positions: Lying on your side with a pillow between your knees. Lying on your back with a pillow under your knees. Lifting  When lifting objects, keep your feet at least shoulder-width apart and tighten your abdominal muscles. Bend your knees and hips and keep your spine neutral. It is important to lift using the strength of your legs, not your back. Do not lock your knees  straight out. Always ask for help to lift heavy or awkward objects. This information is not intended to replace advice given to you by your health care provider. Make sure you discuss any questions you have with your health care provider. Document Revised: 03/30/2020 Document Reviewed: 03/30/2020 Elsevier Patient Education  [employer].

## 2022-04-15 NOTE — Assessment & Plan Note (Addendum)
Reviewed emergency room visit yesterday.  Reassuring CT head, MRI brain. No carotid bruits on exam.  Patient does has chronic tinnitus, hearing loss.   question if BPPV or meniere's disease.  She has been started on Antivert.  We discussed amlodipine and if aggravating.  She is not orthostatic . We discussed monitoring BP at home and consideration for changing to another BP medication to see if possibly aggregated by amlodipine.  Consider ENT consult. Close f/u/

## 2022-05-09 ENCOUNTER — Encounter: Payer: Self-pay | Admitting: Gastroenterology

## 2022-05-13 ENCOUNTER — Encounter: Payer: Self-pay | Admitting: Gastroenterology

## 2022-05-15 NOTE — H&P (Signed)
Pre-Procedure H&P   Patient ID: Colleen Stephenson is a 67 y.o. female.  Gastroenterology Provider: Jaynie Collins, DO  Referring Provider: Glenna Fellows, FNP PCP: Allegra Grana, FNP  Date: 05/16/2022  HPI Colleen Stephenson is a 67 y.o. female who presents today for Colonoscopy for Surveillance-personal history of colon polyps .  Last underwent colonoscopy in May 2017 demonstrating hyperplastic polyps and left-sided diverticulosis.  She did have an adenomatous polyp in the rectosigmoid in November 2011.  No family history of colon cancer or colon polyps  Daily bowel movement without melena hematochezia diarrhea or constipation  Creatinine 0.97 hemoglobin 15 MCV 93.6 platelets 227,000 CMP within normal limits  S/p hysterectomy   Past Medical History:  Diagnosis Date   Endometriosis    Hyperlipemia    Hyperlipidemia    Hypertension    Osteopenia    Vertigo    Vitamin D deficiency     Past Surgical History:  Procedure Laterality Date   BREAST BIOPSY Right    excisional late 1990's   BREAST EXCISIONAL BIOPSY     COLONOSCOPY  2017   with Dr. Mechele Collin; repeat after 5 yrs   COLONOSCOPY     DIAGNOSTIC LAPAROSCOPY     TUBAL LIGATION     VAGINAL HYSTERECTOMY      Family History No h/o GI disease or malignancy  Review of Systems  Constitutional:  Negative for activity change, appetite change, chills, diaphoresis, fatigue, fever and unexpected weight change.  HENT:  Negative for trouble swallowing and voice change.   Respiratory:  Negative for shortness of breath and wheezing.   Cardiovascular:  Negative for chest pain, palpitations and leg swelling.  Gastrointestinal:  Negative for abdominal distention, abdominal pain, anal bleeding, blood in stool, constipation, diarrhea, nausea, rectal pain and vomiting.  Musculoskeletal:  Negative for arthralgias and myalgias.  Skin:  Negative for color change and pallor.  Neurological:  Negative for dizziness, syncope  and weakness.  Psychiatric/Behavioral:  Negative for confusion.   All other systems reviewed and are negative.    Medications No current facility-administered medications on file prior to encounter.   Current Outpatient Medications on File Prior to Encounter  Medication Sig Dispense Refill   amLODipine (NORVASC) 2.5 MG tablet Take 1 tablet (2.5 mg total) by mouth daily. 90 tablet 3   Calcium Carb-Cholecalciferol (CALCIUM 600+D) 600-800 MG-UNIT TABS Take 1 tablet by mouth daily.     rosuvastatin (CRESTOR) 5 MG tablet Take 1 tablet (5 mg total) by mouth daily. 90 tablet 3    Pertinent medications related to GI and procedure were reviewed by me with the patient prior to the procedure   Current Facility-Administered Medications:    0.9 %  sodium chloride infusion, , Intravenous, Continuous, Jaynie Collins, DO      Allergies  Allergen Reactions   Amoxicillin-Pot Clavulanate     Stomach pain and swelling Other reaction(s): Abdominal Pain   Allergies were reviewed by me prior to the procedure  Objective   Body mass index is 24.2 kg/m. Vitals:   05/16/22 1029  BP: (!) 145/83  Pulse: 87  Resp: 16  Temp: (!) 96.8 F (36 C)  TempSrc: Temporal  SpO2: 100%  Weight: 64 kg  Height:  (1.626 m)     Physical Exam Vitals and nursing note reviewed.  Constitutional:      General: She is not in acute distress.    Appearance: Normal appearance. She is not ill-appearing, toxic-appearing or diaphoretic.  HENT:     Head: Normocephalic and atraumatic.     Nose: Nose normal.     Mouth/Throat:     Mouth: Mucous membranes are moist.     Pharynx: Oropharynx is clear.  Eyes:     General: No scleral icterus.    Extraocular Movements: Extraocular movements intact.  Cardiovascular:     Rate and Rhythm: Normal rate and regular rhythm.     Heart sounds: Normal heart sounds. No murmur heard.    No friction rub. No gallop.  Pulmonary:     Effort: Pulmonary effort is normal.  No respiratory distress.     Breath sounds: Normal breath sounds. No wheezing, rhonchi or rales.  Abdominal:     General: Bowel sounds are normal. There is no distension.     Palpations: Abdomen is soft.     Tenderness: There is no abdominal tenderness. There is no guarding or rebound.  Musculoskeletal:     Cervical back: Neck supple.     Right lower leg: No edema.     Left lower leg: No edema.  Skin:    General: Skin is warm and dry.     Coloration: Skin is not jaundiced or pale.  Neurological:     General: No focal deficit present.     Mental Status: She is alert and oriented to person, place, and time. Mental status is at baseline.  Psychiatric:        Mood and Affect: Mood normal.        Behavior: Behavior normal.        Thought Content: Thought content normal.        Judgment: Judgment normal.      Assessment:  [name] is a 67 y.o. female  who presents today for Colonoscopy for Surveillance-personal history of colon polyps .  Plan:  Colonoscopy with possible intervention today  Colonoscopy with possible biopsy, control of bleeding, polypectomy, and interventions as necessary has been discussed with the patient/patient representative. Informed consent was obtained from the patient/patient representative after explaining the indication, nature, and risks of the procedure including but not limited to death, bleeding, perforation, missed neoplasm/lesions, cardiorespiratory compromise, and reaction to medications. Opportunity for questions was given and appropriate answers were provided. Patient/patient representative has verbalized understanding is amenable to undergoing the procedure.   Jaynie Collins, DO  [facility]  Portions of the record may have been created with voice recognition software. Occasional wrong-word or 'sound-a-like' substitutions may have occurred due to the inherent limitations of voice recognition software.  Read the  chart carefully and recognize, using context, where substitutions may have occurred.

## 2022-05-16 ENCOUNTER — Ambulatory Visit: Payer: Medicare Other | Admitting: Anesthesiology

## 2022-05-16 ENCOUNTER — Encounter: Payer: Self-pay | Admitting: Gastroenterology

## 2022-05-16 ENCOUNTER — Encounter
Admission: RE | Disposition: A | Discharge status: Home / Self Care | Payer: Self-pay | Source: Home / Self Care | Attending: Gastroenterology

## 2022-05-16 ENCOUNTER — Ambulatory Visit
Admission: RE | Admit: 2022-05-16 | Discharge: 2022-05-16 | Disposition: A | Discharge status: Home / Self Care | Payer: Medicare Other | Attending: Gastroenterology | Admitting: Gastroenterology

## 2022-05-16 ENCOUNTER — Other Ambulatory Visit: Payer: Self-pay

## 2022-05-16 DIAGNOSIS — Z86010 Personal history of colonic polyps: Secondary | ICD-10-CM | POA: Diagnosis not present

## 2022-05-16 DIAGNOSIS — I10 Essential (primary) hypertension: Secondary | ICD-10-CM | POA: Diagnosis not present

## 2022-05-16 DIAGNOSIS — Z1211 Encounter for screening for malignant neoplasm of colon: Secondary | ICD-10-CM | POA: Diagnosis not present

## 2022-05-16 DIAGNOSIS — K5730 Diverticulosis of large intestine without perforation or abscess without bleeding: Secondary | ICD-10-CM | POA: Diagnosis not present

## 2022-05-16 DIAGNOSIS — K649 Unspecified hemorrhoids: Secondary | ICD-10-CM | POA: Diagnosis not present

## 2022-05-16 DIAGNOSIS — K640 First degree hemorrhoids: Secondary | ICD-10-CM | POA: Diagnosis not present

## 2022-05-16 HISTORY — DX: Dizziness and giddiness: R42

## 2022-05-16 HISTORY — PX: COLONOSCOPY: SHX5424

## 2022-05-16 HISTORY — DX: Essential (primary) hypertension: I10

## 2022-05-16 HISTORY — DX: Hyperlipidemia, unspecified: E78.5

## 2022-05-16 SURGERY — COLONOSCOPY
Anesthesia: General

## 2022-05-16 MED ORDER — GLYCOPYRROLATE 0.2 MG/ML IJ SOLN
INTRAMUSCULAR | Status: DC | PRN
  Administered 2022-05-16: .1 mg via INTRAVENOUS

## 2022-05-16 MED ORDER — LIDOCAINE HCL (CARDIAC) PF 100 MG/5ML IV SOSY
PREFILLED_SYRINGE | INTRAVENOUS | Status: DC | PRN
  Administered 2022-05-16: 50 mg via INTRAVENOUS

## 2022-05-16 MED ORDER — SODIUM CHLORIDE 0.9 % IV SOLN: INTRAVENOUS | Status: DC

## 2022-05-16 MED ORDER — PROPOFOL 10 MG/ML IV BOLUS
INTRAVENOUS | Status: DC | PRN
  Administered 2022-05-16: 20 mg via INTRAVENOUS
  Administered 2022-05-16: 70 mg via INTRAVENOUS

## 2022-05-16 MED ORDER — LIDOCAINE HCL (PF) 2 % IJ SOLN
INTRAMUSCULAR | Status: AC
  Filled 2022-05-16: qty 5

## 2022-05-16 MED ORDER — PROPOFOL 10 MG/ML IV BOLUS
INTRAVENOUS | Status: AC
  Filled 2022-05-16: qty 40

## 2022-05-16 MED ORDER — PROPOFOL 500 MG/50ML IV EMUL
INTRAVENOUS | Status: DC | PRN
  Administered 2022-05-16: 75 ug/kg/min via INTRAVENOUS

## 2022-05-16 NOTE — Op Note (Signed)
[facility] Gastroenterology Patient Name: Colleen Stephenson Procedure Date: 05/16/2022 10:39 AM MRN: [id number] Account #: [id number] Date of Birth: 19-Jan-1956 Admit Type: Outpatient Age: 67 Room: [facility] ENDO ROOM 2 Gender: Female Note Status: Finalized Instrument Name: Colonscope [id number] Procedure:             Colonoscopy Indications:           High risk colon cancer surveillance: Personal history                         of colonic polyps Providers:             Jaynie Collins DO, DO Referring MD:          No Local Md, MD (Referring MD) Medicines:             Monitored Anesthesia Care Complications:         No immediate complications. Estimated blood loss: None. Procedure:             Pre-Anesthesia Assessment:                        - Prior to the procedure, a History and Physical was                         performed, and patient medications and allergies were                         reviewed. The patient is competent. The risks and                         benefits of the procedure and the sedation options and                         risks were discussed with the patient. All questions                         were answered and informed consent was obtained.                         Patient identification and proposed procedure were                         verified by the physician, the nurse, the anesthetist                         and the technician in the endoscopy suite. Mental                         Status Examination: alert and oriented. Airway                         Examination: normal oropharyngeal airway and neck                         mobility. Respiratory Examination: clear to                         auscultation. CV Examination: RRR, no murmurs, no S3  or S4. Prophylactic Antibiotics: The patient does not                         require prophylactic antibiotics. Prior                         Anticoagulants: The patient has  taken no anticoagulant                         or antiplatelet agents. ASA Grade Assessment: II - A                         patient with mild systemic disease. After reviewing                         the risks and benefits, the patient was deemed in                         satisfactory condition to undergo the procedure. The                         anesthesia plan was to use monitored anesthesia care                         (MAC). Immediately prior to administration of                         medications, the patient was re-assessed for adequacy                         to receive sedatives. The heart rate, respiratory                         rate, oxygen saturations, blood pressure, adequacy of                         pulmonary ventilation, and response to care were                         monitored throughout the procedure. The physical                         status of the patient was re-assessed after the                         procedure.                        After obtaining informed consent, the colonoscope was                         passed under direct vision. Throughout the procedure,                         the patient's blood pressure, pulse, and oxygen                         saturations were monitored continuously. The  Colonoscope was introduced through the anus and                         advanced to the the terminal ileum, with                         identification of the appendiceal orifice and IC                         valve. The colonoscopy was performed without                         difficulty. The patient tolerated the procedure well.                         The quality of the bowel preparation was evaluated                         using the BBPS [facility] Bowel Preparation Scale) with                         scores of: Right Colon = 3, Transverse Colon = 3 and                         Left Colon = 3 (entire mucosa seen well with no                          residual staining, small fragments of stool or opaque                         liquid). The total BBPS score equals 9. The terminal                         ileum, ileocecal valve, appendiceal orifice, and                         rectum were photographed. Findings:      The perianal and digital rectal examinations were normal. Pertinent       negatives include normal sphincter tone.      The terminal ileum appeared normal. Estimated blood loss: none.      Multiple small-mouthed diverticula were found in the entire colon.       Estimated blood loss: none.      Non-bleeding internal hemorrhoids were found during retroflexion. The       hemorrhoids were Grade I (internal hemorrhoids that do not prolapse).       Estimated blood loss: none.      The exam was otherwise without abnormality on direct and retroflexion       views. Impression:            - The examined portion of the ileum was normal.                        - Diverticulosis in the entire examined colon.                        - Non-bleeding internal hemorrhoids.                        -  The examination was otherwise normal on direct and                         retroflexion views.                        - No specimens collected. Recommendation:        - Patient has a contact number available for                         emergencies. The signs and symptoms of potential                         delayed complications were discussed with the patient.                         Return to normal activities tomorrow. Written                         discharge instructions were provided to the patient.                        - Discharge patient to home.                        - Resume previous diet.                        - Continue present medications.                        - Use original regular Metamucil one teaspoon PO BID.                        - Repeat colonoscopy in 7-10 years for surveillance.                        -  Return to referring physician as previously                         scheduled.                        - No previous advanced polyps. No family history of                         colorectal cancer or polyps. 05/2015 colonoscopy                         without adenomatous polyps.                        - The findings and recommendations were discussed with                         the patient. Procedure Code(s):     --- Professional ---                        [phone], Colonoscopy, flexible; diagnostic, including  collection of specimen(s) by brushing or washing, when                         performed (separate procedure) Diagnosis Code(s):     --- Professional ---                        Z86.010, Personal history of colonic polyps                        K64.0, First degree hemorrhoids                        K57.30, Diverticulosis of large intestine without                         perforation or abscess without bleeding CPT copyright 2022 American Medical Association. All rights reserved. The codes documented in this report are preliminary and upon coder review may  be revised to meet current compliance requirements. Attending Participation:      I personally performed the entire procedure. Elfredia Nevins, DO Jaynie Collins DO, DO 05/16/2022 11:09:49 AM This report has been signed electronically. Number of Addenda: 0 Note Initiated On: 05/16/2022 10:39 AM Scope Withdrawal Time: 0 hours 9 minutes 57 seconds  Total Procedure Duration: 0 hours 18 minutes 22 seconds  Estimated Blood Loss:  Estimated blood loss: none.      [facility]

## 2022-05-16 NOTE — Anesthesia Postprocedure Evaluation (Signed)
Anesthesia Post Note  Patient: Colleen Stephenson  Procedure(s) Performed: COLONOSCOPY  Patient location during evaluation: Endoscopy Anesthesia Type: General Level of consciousness: awake and alert Pain management: pain level controlled Vital Signs Assessment: post-procedure vital signs reviewed and stable Respiratory status: spontaneous breathing, nonlabored ventilation, respiratory function stable and patient connected to nasal cannula oxygen Cardiovascular status: blood pressure returned to baseline and stable Postop Assessment: no apparent nausea or vomiting Anesthetic complications: no  No notable events documented.   Last Vitals:  Vitals:   05/16/22 1107 05/16/22 1117  BP: 111/71 125/74  Pulse: 80 76  Resp: 17 15  Temp: (!) 35.8 C   SpO2: 99% 100%    Last Pain:  Vitals:   05/16/22 1117  TempSrc:   PainSc: 0-No pain                 Stephanie Coup

## 2022-05-16 NOTE — Interval H&P Note (Signed)
History and Physical Interval Note: Preprocedure H&P from 05/16/22  was reviewed and there was no interval change after seeing and examining the patient.  Written consent was obtained from the patient after discussion of risks, benefits, and alternatives. Patient has consented to proceed with Colonoscopy with possible intervention   05/16/2022 10:31 AM  Colleen Stephenson  has presented today for surgery, with the diagnosis of History of adenomatous polyp of colon (Z86.010).  The various methods of treatment have been discussed with the patient and family. After consideration of risks, benefits and other options for treatment, the patient has consented to  Procedure(s): COLONOSCOPY (N/A) as a surgical intervention.  The patient's history has been reviewed, patient examined, no change in status, stable for surgery.  I have reviewed the patient's chart and labs.  Questions were answered to the patient's satisfaction.     Jaynie Collins

## 2022-05-16 NOTE — Transfer of Care (Signed)
Immediate Anesthesia Transfer of Care Note  Patient: Colleen Stephenson  Procedure(s) Performed: COLONOSCOPY  Patient Location: Endoscopy Unit  Anesthesia Type:General  Level of Consciousness: awake, oriented, and patient cooperative  Airway & Oxygen Therapy: Patient Spontanous Breathing  Post-op Assessment: Report given to RN and Post -op Vital signs reviewed and stable  Post vital signs: Reviewed and stable  Last Vitals:  Vitals Value Taken Time  BP 111/71 05/16/22 1107  Temp    Pulse 81 05/16/22 1107  Resp 19 05/16/22 1107  SpO2 98 % 05/16/22 1107  Vitals shown include unvalidated device data.  Last Pain:  Vitals:   05/16/22 1029  TempSrc: Temporal         Complications: No notable events documented.

## 2022-05-16 NOTE — Anesthesia Preprocedure Evaluation (Signed)
Anesthesia Evaluation  Patient identified by MRN, date of birth, ID band Patient awake    Reviewed: Allergy & Precautions, NPO status , Patient's Chart, lab work & pertinent test results  Airway Mallampati: III  TM Distance: >3 FB Neck ROM: full    Dental  (+) Chipped, Dental Advidsory Given   Pulmonary neg pulmonary ROS, neg shortness of breath, neg COPD   Pulmonary exam normal        Cardiovascular hypertension, negative cardio ROS Normal cardiovascular exam     Neuro/Psych negative neurological ROS  negative psych ROS   GI/Hepatic negative GI ROS, Neg liver ROS,,,  Endo/Other  negative endocrine ROS    Renal/GU negative Renal ROS  negative genitourinary   Musculoskeletal   Abdominal   Peds  Hematology negative hematology ROS (+)   Anesthesia Other Findings Past Medical History: No date: Endometriosis No date: Hyperlipemia No date: Hyperlipidemia No date: Hypertension No date: Osteopenia No date: Vertigo No date: Vitamin D deficiency  Past Surgical History: No date: BREAST BIOPSY; Right     Comment:  excisional late 1990's No date: BREAST EXCISIONAL BIOPSY 2017: COLONOSCOPY     Comment:  with Dr. Mechele Collin; repeat after 5 yrs No date: COLONOSCOPY No date: DIAGNOSTIC LAPAROSCOPY No date: TUBAL LIGATION No date: VAGINAL HYSTERECTOMY  BMI    Body Mass Index: 24.20 kg/m      Reproductive/Obstetrics negative OB ROS                             Anesthesia Physical Anesthesia Plan  ASA: 2  Anesthesia Plan: General   Post-op Pain Management: Minimal or no pain anticipated   Induction: Intravenous  PONV Risk Score and Plan: 3 and Propofol infusion, TIVA and Ondansetron  Airway Management Planned: Nasal Cannula  Additional Equipment: None  Intra-op Plan:   Post-operative Plan:   Informed Consent: I have reviewed the patients History and Physical, chart, labs and  discussed the procedure including the risks, benefits and alternatives for the proposed anesthesia with the patient or authorized representative who has indicated his/her understanding and acceptance.     Dental advisory given  Plan Discussed with: CRNA and Surgeon  Anesthesia Plan Comments: (Discussed risks of anesthesia with patient, including possibility of difficulty with spontaneous ventilation under anesthesia necessitating airway intervention, PONV, and rare risks such as cardiac or respiratory or neurological events, and allergic reactions. Discussed the role of CRNA in patient's perioperative care. Patient understands.)       Anesthesia Quick Evaluation

## 2022-05-18 ENCOUNTER — Encounter: Payer: Self-pay | Admitting: Gastroenterology

## 2022-05-18 ENCOUNTER — Ambulatory Visit: Payer: Medicare Other | Admitting: Family

## 2022-05-23 ENCOUNTER — Ambulatory Visit
Admission: RE | Admit: 2022-05-23 | Discharge: 2022-05-23 | Disposition: A | Discharge status: Home / Self Care | Payer: Medicare Other | Source: Ambulatory Visit | Attending: Obstetrics and Gynecology | Admitting: Obstetrics and Gynecology

## 2022-05-23 ENCOUNTER — Encounter: Payer: Self-pay | Admitting: Obstetrics and Gynecology

## 2022-05-23 DIAGNOSIS — R928 Other abnormal and inconclusive findings on diagnostic imaging of breast: Secondary | ICD-10-CM | POA: Diagnosis not present

## 2022-05-23 DIAGNOSIS — R921 Mammographic calcification found on diagnostic imaging of breast: Secondary | ICD-10-CM | POA: Diagnosis not present

## 2022-05-31 ENCOUNTER — Encounter: Payer: Self-pay | Admitting: Gastroenterology

## 2022-09-07 DIAGNOSIS — E038 Other specified hypothyroidism: Secondary | ICD-10-CM | POA: Diagnosis not present

## 2022-09-15 DIAGNOSIS — E063 Autoimmune thyroiditis: Secondary | ICD-10-CM | POA: Diagnosis not present

## 2022-09-15 DIAGNOSIS — E038 Other specified hypothyroidism: Secondary | ICD-10-CM | POA: Diagnosis not present

## 2022-10-04 ENCOUNTER — Other Ambulatory Visit: Payer: Self-pay | Admitting: Family

## 2022-10-04 DIAGNOSIS — Z0000 Encounter for general adult medical examination without abnormal findings: Secondary | ICD-10-CM

## 2022-10-04 DIAGNOSIS — I10 Essential (primary) hypertension: Secondary | ICD-10-CM

## 2022-10-05 ENCOUNTER — Other Ambulatory Visit: Payer: Self-pay | Admitting: Obstetrics and Gynecology

## 2022-10-05 DIAGNOSIS — Z1231 Encounter for screening mammogram for malignant neoplasm of breast: Secondary | ICD-10-CM

## 2022-10-05 DIAGNOSIS — R921 Mammographic calcification found on diagnostic imaging of breast: Secondary | ICD-10-CM

## 2022-10-27 ENCOUNTER — Ambulatory Visit: Payer: Medicare Other | Admitting: Obstetrics and Gynecology

## 2022-11-09 ENCOUNTER — Ambulatory Visit: Payer: Medicare Other | Admitting: Family

## 2022-11-09 ENCOUNTER — Encounter: Payer: Self-pay | Admitting: Family

## 2022-11-09 VITALS — BP 122/72 | HR 74 | Temp 98.1°F | Ht 64.0 in | Wt 144.2 lb

## 2022-11-09 DIAGNOSIS — E559 Vitamin D deficiency, unspecified: Secondary | ICD-10-CM

## 2022-11-09 DIAGNOSIS — M8580 Other specified disorders of bone density and structure, unspecified site: Secondary | ICD-10-CM

## 2022-11-09 DIAGNOSIS — Z0000 Encounter for general adult medical examination without abnormal findings: Secondary | ICD-10-CM | POA: Diagnosis not present

## 2022-11-09 DIAGNOSIS — E785 Hyperlipidemia, unspecified: Secondary | ICD-10-CM

## 2022-11-09 DIAGNOSIS — I10 Essential (primary) hypertension: Secondary | ICD-10-CM

## 2022-11-09 DIAGNOSIS — Z8639 Personal history of other endocrine, nutritional and metabolic disease: Secondary | ICD-10-CM

## 2022-11-09 DIAGNOSIS — Z780 Asymptomatic menopausal state: Secondary | ICD-10-CM | POA: Diagnosis not present

## 2022-11-09 LAB — CBC WITH DIFFERENTIAL/PLATELET
Basophils Absolute: 0 10*3/uL (ref 0.0–0.1)
Basophils Relative: 0.6 % (ref 0.0–3.0)
Eosinophils Absolute: 0.1 10*3/uL (ref 0.0–0.7)
Eosinophils Relative: 2.2 % (ref 0.0–5.0)
HCT: 43.6 % (ref 36.0–46.0)
Hemoglobin: 14.2 g/dL (ref 12.0–15.0)
Lymphocytes Relative: 19.6 % (ref 12.0–46.0)
Lymphs Abs: 1 10*3/uL (ref 0.7–4.0)
MCHC: 32.6 g/dL (ref 30.0–36.0)
MCV: 95.4 fL (ref 78.0–100.0)
Monocytes Absolute: 0.4 10*3/uL (ref 0.1–1.0)
Monocytes Relative: 7.6 % (ref 3.0–12.0)
Neutro Abs: 3.5 10*3/uL (ref 1.4–7.7)
Neutrophils Relative %: 70 % (ref 43.0–77.0)
Platelets: 219 10*3/uL (ref 150.0–400.0)
RBC: 4.57 Mil/uL (ref 3.87–5.11)
RDW: 13 % (ref 11.5–15.5)
WBC: 5 10*3/uL (ref 4.0–10.5)

## 2022-11-09 LAB — COMPREHENSIVE METABOLIC PANEL
ALT: 12 U/L (ref 0–35)
AST: 19 U/L (ref 0–37)
Albumin: 4.5 g/dL (ref 3.5–5.2)
Alkaline Phosphatase: 85 U/L (ref 39–117)
BUN: 16 mg/dL (ref 6–23)
CO2: 31 meq/L (ref 19–32)
Calcium: 9.9 mg/dL (ref 8.4–10.5)
Chloride: 101 meq/L (ref 96–112)
Creatinine, Ser: 0.9 mg/dL (ref 0.40–1.20)
GFR: 66.27 mL/min (ref >60.00)
Glucose, Bld: 88 mg/dL (ref 70–99)
Potassium: 4.3 meq/L (ref 3.5–5.1)
Sodium: 138 meq/L (ref 135–145)
Total Bilirubin: 0.9 mg/dL (ref 0.2–1.2)
Total Protein: 7.3 g/dL (ref 6.0–8.3)

## 2022-11-09 LAB — LIPID PANEL
Cholesterol: 266 mg/dL — ABNORMAL HIGH (ref 0–200)
HDL: 80 mg/dL (ref >39.00)
LDL Cholesterol: 168 mg/dL — ABNORMAL HIGH (ref 0–99)
NonHDL: 185.61
Total CHOL/HDL Ratio: 3
Triglycerides: 86 mg/dL (ref 0.0–149.0)
VLDL: 17.2 mg/dL (ref 0.0–40.0)

## 2022-11-09 LAB — VITAMIN D 25 HYDROXY (VIT D DEFICIENCY, FRACTURES): VITD: 35.21 ng/mL (ref 30.00–100.00)

## 2022-11-09 NOTE — Assessment & Plan Note (Signed)
Deferred breast exam and pelvic exam as patient is continue following with GYN. She declines PCV20. She will schedule DEXA. Encouraged continued exercise.

## 2022-11-09 NOTE — Assessment & Plan Note (Addendum)
The 10-year ASCVD risk score (Gaspar Fowle DK, et al., 2019) is: 8.2%  CT calcium score 02/2022, low risk, 0.   Fortunately no family h/o ASCVD.   Patient side effects with crestor; may consider lipitor. Pending lipid panel.

## 2022-11-09 NOTE — Patient Instructions (Addendum)
Please call  and schedule your 3D mammogram and /or bone density scan as we discussed.   [facility]  ( new location in 2023)  [address], [place], [place] [address]  [place], [place]  [phone]   Health Maintenance for Postmenopausal Women Menopause is a normal process in which your ability to get pregnant comes to an end. This process happens slowly over many months or years, usually between the ages of 67 and 53. Menopause is complete when you have missed your menstrual period for 12 months. It is important to talk with your health care provider about some of the most common conditions that affect women after menopause (postmenopausal women). These include heart disease, cancer, and bone loss (osteoporosis). Adopting a healthy lifestyle and getting preventive care can help to promote your health and wellness. The actions you take can also lower your chances of developing some of these common conditions. What are the signs and symptoms of menopause? During menopause, you may have the following symptoms: Hot flashes. These can be moderate or severe. Night sweats. Decrease in sex drive. Mood swings. Headaches. Tiredness (fatigue). Irritability. Memory problems. Problems falling asleep or staying asleep. Talk with your health care provider about treatment options for your symptoms. Do I need hormone replacement therapy? Hormone replacement therapy is effective in treating symptoms that are caused by menopause, such as hot flashes and night sweats. Hormone replacement carries certain risks, especially as you become older. If you are thinking about using estrogen or estrogen with progestin, discuss the benefits and risks with your health care provider. How can I reduce my risk for heart disease and stroke? The risk of heart disease, heart attack, and stroke increases as you age. One of the causes may be a change in the body's hormones during menopause. This can  affect how your body uses dietary fats, triglycerides, and cholesterol. Heart attack and stroke are medical emergencies. There are many things that you can do to help prevent heart disease and stroke. Watch your blood pressure High blood pressure causes heart disease and increases the risk of stroke. This is more likely to develop in people who have high blood pressure readings or are overweight. Have your blood pressure checked: Every 3-5 years if you are 67-76 years of age. Every year if you are 67 years old or older. Eat a healthy diet  Eat a diet that includes plenty of vegetables, fruits, low-fat dairy products, and lean protein. Do not eat a lot of foods that are high in solid fats, added sugars, or sodium. Get regular exercise Get regular exercise. This is one of the most important things you can do for your health. Most adults should: Try to exercise for at least 150 minutes each week. The exercise should increase your heart rate and make you sweat (moderate-intensity exercise). Try to do strengthening exercises at least twice each week. Do these in addition to the moderate-intensity exercise. Spend less time sitting. Even light physical activity can be beneficial. Other tips Work with your health care provider to achieve or maintain a healthy weight. Do not use any products that contain nicotine or tobacco. These products include cigarettes, chewing tobacco, and vaping devices, such as e-cigarettes. If you need help quitting, ask your health care provider. Know your numbers. Ask your health care provider to check your cholesterol and your blood sugar (glucose). Continue to have your blood tested as directed by your health care provider. Do I need screening for cancer? Depending  on your health history and family history, you may need to have cancer screenings at different stages of your life. This may include screening for: Breast cancer. Cervical cancer. Lung cancer. Colorectal  cancer. What is my risk for osteoporosis? After menopause, you may be at increased risk for osteoporosis. Osteoporosis is a condition in which bone destruction happens more quickly than new bone creation. To help prevent osteoporosis or the bone fractures that can happen because of osteoporosis, you may take the following actions: If you are 50-41 years old, get at least 1,000 mg of calcium and at least 600 international units (IU) of vitamin D per day. If you are older than age 67 but younger than age 67, get at least 1,200 mg of calcium and at least 600 international units (IU) of vitamin D per day. If you are older than age 67, get at least 1,200 mg of calcium and at least 800 international units (IU) of vitamin D per day. Smoking and drinking excessive alcohol increase the risk of osteoporosis. Eat foods that are rich in calcium and vitamin D, and do weight-bearing exercises several times each week as directed by your health care provider. How does menopause affect my mental health? Depression may occur at any age, but it is more common as you become older. Common symptoms of depression include: Feeling depressed. Changes in sleep patterns. Changes in appetite or eating patterns. Feeling an overall lack of motivation or enjoyment of activities that you previously enjoyed. Frequent crying spells. Talk with your health care provider if you think that you are experiencing any of these symptoms. General instructions See your health care provider for regular wellness exams and vaccines. This may include: Scheduling regular health, dental, and eye exams. Getting and maintaining your vaccines. These include: Influenza vaccine. Get this vaccine each year before the flu season begins. Pneumonia vaccine. Shingles vaccine. Tetanus, diphtheria, and pertussis (Tdap) booster vaccine. Your health care provider may also recommend other immunizations. Tell your health care provider if you have ever been  abused or do not feel safe at home. Summary Menopause is a normal process in which your ability to get pregnant comes to an end. This condition causes hot flashes, night sweats, decreased interest in sex, mood swings, headaches, or lack of sleep. Treatment for this condition may include hormone replacement therapy. Take actions to keep yourself healthy, including exercising regularly, eating a healthy diet, watching your weight, and checking your blood pressure and blood sugar levels. Get screened for cancer and depression. Make sure that you are up to date with all your vaccines. This information is not intended to replace advice given to you by your health care provider. Make sure you discuss any questions you have with your health care provider. Document Revised: 06/01/2020 Document Reviewed: 06/01/2020 Elsevier Patient Education  2024 [employer].

## 2022-11-09 NOTE — Assessment & Plan Note (Signed)
Chronic, stable.  Continue amlodipine 2.5mg 

## 2022-11-09 NOTE — Progress Notes (Signed)
Assessment & Plan:  Hyperlipidemia, unspecified hyperlipidemia type Assessment & Plan: The 10-year ASCVD risk score (Lillianne Eick DK, et al., 2019) is: 8.2%  CT calcium score 02/2022, low risk, 0.   Fortunately no family h/o ASCVD.   Patient side effects with crestor; may consider lipitor. Pending lipid panel.   Orders: -     Lipid panel  Postmenopausal estrogen deficiency -     VITAMIN D 25 Hydroxy (Vit-D Deficiency, Fractures) -     CBC with Differential/Platelet -     Comprehensive metabolic panel -     DG Bone Density; Future  Osteopenia, unspecified location -     VITAMIN D 25 Hydroxy (Vit-D Deficiency, Fractures) -     CBC with Differential/Platelet  History of vitamin D deficiency -     VITAMIN D 25 Hydroxy (Vit-D Deficiency, Fractures)  Vitamin D deficiency, unspecified -     VITAMIN D 25 Hydroxy (Vit-D Deficiency, Fractures)  Annual physical exam Assessment & Plan: Deferred breast exam and pelvic exam as patient is continue following with GYN. She declines PCV20. She will schedule DEXA. Encouraged continued exercise.    Hypertension, unspecified type Assessment & Plan: Chronic, stable. Continue amlodipine 2.5 mg.        Return precautions given.   Risks, benefits, and alternatives of the medications and treatment plan prescribed today were discussed, and patient expressed understanding.   Education regarding symptom management and diagnosis given to patient on AVS either electronically or printed.  Return in about 1 year (around 11/09/2023) for Complete Physical Exam.  Rennie Plowman, FNP  Subjective:    Patient ID: Colleen Stephenson, female    DOB: 05-03-1955, 5 y.o.   MRN: [id number]  CC: Colleen Stephenson is a 67 y.o. female who presents today for physical exam.    HPI: Feels well today No new complaints Fatigue has improved since starting synthroid.     She is no longer on crestor due to side effects.  Follows with endocrinology, Dr Tedd Sias.  Last  seen 09/15/2022 for hypothyroidism. Recently started on synthroid .   Colorectal Cancer Screening: UTD, Dr Timothy Lasso, 04/2022; due in 7-10 years.  Breast Cancer Screening: Mammogram scheduled for 6 month diagnostic BL mammogram  Cervical Cancer Screening: h/o hysterectomy;Last Pap: 02/02/17 Results were: no abnormalities /neg HPV DNA.   Bone Health screening/DEXA for 65+: due  Lung Cancer Screening: Doesn't have 20 year pack year history and age > 53 years yo 58 years  AAA screen for all men and women aged 60 to 75 with h/o tobacco, men 9 years older with family h/o AAA and women 65 years or older who have ever smoked or have family history of AAA.        Tetanus - due        Pneumococcal - Candidate for.   Exercise: Gets regular exercise, walking 2 miles per day; riding her bicycle.  Alcohol use:  occassionally Smoking/tobacco use: Nonsmoker.    Health Maintenance  Topic Date Due   DTaP/Tdap/Td vaccine (1 - Tdap) Never done   COVID-19 Vaccine (3 - 2023-24 season) 09/25/2022   Zoster (Shingles) Vaccine (1 of 2) 02/09/2023*   Flu Shot  04/24/2023*   Pneumonia Vaccine (1 of 1 - PCV) 11/09/2023*   Medicare Annual Wellness Visit  03/25/2023   Mammogram  10/27/2023   Colon Cancer Screening  05/15/2029   DEXA scan (bone density measurement)  Completed   HPV Vaccine  Aged Out   Hepatitis C Screening  Discontinued  *  Topic was postponed. The date shown is not the original due date.    ALLERGIES: Amoxicillin-pot clavulanate and Crestor [rosuvastatin]  Current Outpatient Medications on File Prior to Visit  Medication Sig Dispense Refill   amLODipine (NORVASC) 2.5 MG tablet TAKE 1 TABLET(2.5 MG) BY MOUTH DAILY 90 tablet 3   Calcium Carb-Cholecalciferol (CALCIUM 600+D) 600-800 MG-UNIT TABS Take 1 tablet by mouth daily.     cyclobenzaprine (FLEXERIL) 10 MG tablet Take 1 tablet (10 mg total) by mouth 3 (three) times daily as needed for muscle spasms. 30 tablet 0   levothyroxine  (SYNTHROID) 50 MCG tablet Take by mouth.     meclizine (ANTIVERT) 50 MG tablet Take 1/2 or 1 pills 3 times daily as needed for dizziness 30 tablet 0   traZODone (DESYREL) 50 MG tablet TAKE 1/2 TO 1 TABLET(25 TO 50 MG) BY MOUTH AT BEDTIME AS NEEDED FOR SLEEP 30 tablet 2   No current facility-administered medications on file prior to visit.    Review of Systems  Constitutional:  Negative for chills and fever.  Respiratory:  Negative for cough.   Cardiovascular:  Negative for chest pain and palpitations.  Gastrointestinal:  Negative for nausea and vomiting.      Objective:    BP 122/72   Pulse 74   Temp 98.1 F (36.7 C) (Oral)   Ht 5\' 4"  (1.626 m)   Wt 144 lb 3.2 oz (65.4 kg)   PF 97 L/min   BMI 24.75 kg/m   BP Readings from Last 3 Encounters:  11/09/22 122/72  05/16/22 134/79  04/15/22 136/86   Wt Readings from Last 3 Encounters:  11/09/22 144 lb 3.2 oz (65.4 kg)  05/16/22 141 lb (64 kg)  04/15/22 143 lb 6.4 oz (65 kg)    Physical Exam Vitals reviewed.  Constitutional:      Appearance: She is well-developed.  Eyes:     Conjunctiva/sclera: Conjunctivae normal.  Cardiovascular:     Rate and Rhythm: Normal rate and regular rhythm.     Pulses: Normal pulses.     Heart sounds: Normal heart sounds.  Pulmonary:     Effort: Pulmonary effort is normal.     Breath sounds: Normal breath sounds. No wheezing, rhonchi or rales.  Skin:    General: Skin is warm and dry.  Neurological:     Mental Status: She is alert.  Psychiatric:        Speech: Speech normal.        Behavior: Behavior normal.        Thought Content: Thought content normal.

## 2022-11-14 DIAGNOSIS — E063 Autoimmune thyroiditis: Secondary | ICD-10-CM | POA: Diagnosis not present

## 2022-11-17 ENCOUNTER — Other Ambulatory Visit: Payer: Self-pay

## 2022-11-17 ENCOUNTER — Encounter: Payer: Self-pay | Admitting: Family

## 2022-11-17 DIAGNOSIS — E785 Hyperlipidemia, unspecified: Secondary | ICD-10-CM

## 2022-11-17 DIAGNOSIS — E063 Autoimmune thyroiditis: Secondary | ICD-10-CM | POA: Diagnosis not present

## 2022-11-17 DIAGNOSIS — Z780 Asymptomatic menopausal state: Secondary | ICD-10-CM

## 2022-11-17 MED ORDER — ATORVASTATIN CALCIUM 10 MG PO TABS
10.0000 mg | ORAL_TABLET | Freq: Every day | ORAL | 1 refills | Status: DC
Start: 2022-11-17 — End: 2023-01-02

## 2022-11-21 DIAGNOSIS — H2513 Age-related nuclear cataract, bilateral: Secondary | ICD-10-CM | POA: Diagnosis not present

## 2022-11-23 ENCOUNTER — Ambulatory Visit
Admission: RE | Admit: 2022-11-23 | Discharge: 2022-11-23 | Disposition: A | Discharge status: Home / Self Care | Payer: Medicare Other | Source: Ambulatory Visit | Attending: Obstetrics and Gynecology | Admitting: Obstetrics and Gynecology

## 2022-11-23 DIAGNOSIS — Z1231 Encounter for screening mammogram for malignant neoplasm of breast: Secondary | ICD-10-CM | POA: Insufficient documentation

## 2022-11-23 DIAGNOSIS — R921 Mammographic calcification found on diagnostic imaging of breast: Secondary | ICD-10-CM | POA: Diagnosis not present

## 2022-11-23 DIAGNOSIS — R92333 Mammographic heterogeneous density, bilateral breasts: Secondary | ICD-10-CM | POA: Diagnosis not present

## 2022-11-24 ENCOUNTER — Encounter: Payer: Self-pay | Admitting: Obstetrics and Gynecology

## 2022-12-29 NOTE — Progress Notes (Signed)
PCP: Allegra Grana, FNP   Chief Complaint  Patient presents with   Gynecologic Exam    No concerns    HPI:      [name] is a 67 y.o. G2P2002 who LMP was No LMP recorded. Patient has had a hysterectomy., presents today for her MEDICARE annual examination.  Her menses are absent due to menopause/TVH. She does not have PMB. She denies vasomotor sx.   Sex activity: occas; single partner, contraception - post menopausal status. She does have vaginal dryness/discomfort, not interested in vag ERT.  Last Pap: 02/02/17 Results were: no abnormalities /neg HPV DNA. No longer indicated Hx of STDs: none  Last mammogram: 11/23/22 Results were: cat 3 LT breast--dx mammo follow-up in 12 months There is a FH of breast cancer in her brother (dx 12/21--turned out to be cancer in the breast but breast not primary site) and pat cousin. Her brother is BRCA/panel negative. There is no FH of ovarian cancer. The patient does do self-breast exams.  Colonoscopy: colonoscopy 4/24 at [facility]. Repeat due after 10 years now.  Tobacco use: The patient denies current or previous tobacco use. Alcohol use: social drinker  No drug use Exercise: moderately active  DEXA : 2022 at [facility] with osteopenia in hip and spine. No Rx tx needed. Due for repeat this yr  She does get adequate calcium and Vitamin D in her diet.  Labs with PCP  Past Medical History:  Diagnosis Date   Endometriosis    Hyperlipemia    Hyperlipidemia    Hypertension    Osteopenia    Vertigo    Vitamin D deficiency     Past Surgical History:  Procedure Laterality Date   BREAST BIOPSY Right    excisional late 1990's   BREAST EXCISIONAL BIOPSY     COLONOSCOPY  2017   with Dr. Mechele Collin; repeat after 5 yrs   COLONOSCOPY     COLONOSCOPY N/A 05/16/2022   Procedure: COLONOSCOPY;  Surgeon: Jaynie Collins, DO;  Location: [facility];  Service: Gastroenterology;  Laterality: N/A;   DIAGNOSTIC LAPAROSCOPY     TUBAL  LIGATION     VAGINAL HYSTERECTOMY      Family History  Problem Relation Age of Onset   Stomach cancer Father    Breast cancer Brother 51       BRCA/panel neg   Breast cancer Cousin        not sure of age, not close with relative   Bone cancer Neg Hx    Hypertension Neg Hx    Heart disease Neg Hx     Social History   Socioeconomic History   Marital status: Single    Spouse name: Not on file   Number of children: Not on file   Years of education: Not on file   Highest education level: Master's degree (e.g., MA, MS, MEng, MEd, MSW, MBA)  Occupational History   Not on file  Tobacco Use   Smoking status: Never   Smokeless tobacco: Never  Vaping Use   Vaping status: Never Used  Substance and Sexual Activity   Alcohol use: Yes    Comment: couple times of week wine or liquor.   Drug use: No   Sexual activity: Yes    Birth control/protection: Surgical    Comment: Hysterectomy  Other Topics Concern   Not on file  Social History Narrative   Retired - Production assistant, radio      Social Determinants of Health  Financial Resource Strain: Low Risk  (04/14/2022)   Overall Financial Resource Strain (CARDIA)    Difficulty of Paying Living Expenses: Not hard at all  Food Insecurity: No Food Insecurity (04/14/2022)   Hunger Vital Sign    Worried About Running Out of Food in the Last Year: Never true    Ran Out of Food in the Last Year: Never true  Transportation Needs: No Transportation Needs (04/14/2022)   PRAPARE - Administrator, Civil Service (Medical): No    Lack of Transportation (Non-Medical): No  Physical Activity: Sufficiently Active (04/14/2022)   Exercise Vital Sign    Days of Exercise per Week: 7 days    Minutes of Exercise per Session: 50 min  Stress: No Stress Concern Present (04/14/2022)   [employer] of Occupational Health - Occupational Stress Questionnaire    Feeling of Stress : Not at all  Social Connections: Socially Isolated (04/14/2022)    Social Connection and Isolation Panel [NHANES]    Frequency of Communication with Friends and Family: More than three times a week    Frequency of Social Gatherings with Friends and Family: More than three times a week    Attends Religious Services: Never    Database administrator or Organizations: No    Attends Engineer, structural: Not on file    Marital Status: Divorced  Intimate Partner Violence: Not At Risk (03/25/2022)   Humiliation, Afraid, Rape, and Kick questionnaire    Fear of Current or Ex-Partner: No    Emotionally Abused: No    Physically Abused: No    Sexually Abused: No    Current Meds  Medication Sig   amLODipine (NORVASC) 2.5 MG tablet TAKE 1 TABLET(2.5 MG) BY MOUTH DAILY   Calcium Carb-Cholecalciferol (CALCIUM 600+D) 600-800 MG-UNIT TABS Take 1 tablet by mouth daily.   cyclobenzaprine (FLEXERIL) 10 MG tablet Take 1 tablet (10 mg total) by mouth 3 (three) times daily as needed for muscle spasms.   levothyroxine (SYNTHROID) 50 MCG tablet Take by mouth.   meclizine (ANTIVERT) 50 MG tablet Take 1/2 or 1 pills 3 times daily as needed for dizziness   traZODone (DESYREL) 50 MG tablet TAKE 1/2 TO 1 TABLET(25 TO 50 MG) BY MOUTH AT BEDTIME AS NEEDED FOR SLEEP      ROS:  Review of Systems  Constitutional:  Negative for fatigue, fever and unexpected weight change.  Respiratory:  Negative for cough, shortness of breath and wheezing.   Cardiovascular:  Negative for chest pain, palpitations and leg swelling.  Gastrointestinal:  Negative for blood in stool, constipation, diarrhea, nausea and vomiting.  Endocrine: Negative for cold intolerance, heat intolerance and polyuria.  Genitourinary:  Negative for dyspareunia, dysuria, flank pain, frequency, genital sores, hematuria, menstrual problem, pelvic pain, urgency, vaginal bleeding, vaginal discharge and vaginal pain.  Musculoskeletal:  Negative for back pain, joint swelling and myalgias.  Skin:  Negative for rash.   Neurological:  Negative for dizziness, syncope, light-headedness, numbness and headaches.  Hematological:  Negative for adenopathy.  Psychiatric/Behavioral:  Negative for agitation, confusion, sleep disturbance and suicidal ideas. The patient is not nervous/anxious.      Objective: BP 133/70   Pulse 76   Ht 5\' 4"  (1.626 m)   Wt 146 lb (66.2 kg)   BMI 25.06 kg/m    Physical Exam Constitutional:      Appearance: She is well-developed.  Genitourinary:     Vulva normal.     Genitourinary Comments: UTERUS/CX SURG REM  Right Labia: No rash, tenderness or lesions.    Left Labia: No tenderness, lesions or rash.    Vaginal cuff intact.    No vaginal discharge, erythema or tenderness.      Right Adnexa: not tender and no mass present.    Left Adnexa: not tender and no mass present.    Cervix is absent.     Uterus is absent.  Breasts:    Right: No mass, nipple discharge, skin change or tenderness.     Left: No mass, nipple discharge, skin change or tenderness.  Neck:     Thyroid: No thyromegaly.  Cardiovascular:     Rate and Rhythm: Normal rate and regular rhythm.     Heart sounds: Normal heart sounds. No murmur heard. Pulmonary:     Effort: Pulmonary effort is normal.     Breath sounds: Normal breath sounds.  Abdominal:     Palpations: Abdomen is soft.     Tenderness: There is no abdominal tenderness. There is no guarding.  Musculoskeletal:        General: Normal range of motion.     Cervical back: Normal range of motion.  Neurological:     General: No focal deficit present.     Mental Status: She is alert and oriented to person, place, and time.     Cranial Nerves: No cranial nerve deficit.  Skin:    General: Skin is warm and dry.  Psychiatric:        Mood and Affect: Mood normal.        Behavior: Behavior normal.        Thought Content: Thought content normal.        Judgment: Judgment normal.  Vitals reviewed.     Assessment/Plan:  Encounter for annual  routine gynecological examination  Encounter for screening mammogram for malignant neoplasm of breast; pt current on mammo  Family history of breast cancer--pt's brother is cancer genetic testing neg; pt doesn't qualify for genetic testing  Screening for osteoporosis - Plan: DG Bone Density; pt to schedule DEXA  Osteopenia of hip, unspecified laterality - Plan: DG Bone Density  Estrogen deficiency - Plan: DG Bone Density         GYN counsel mammography screening, adequate intake of calcium and vitamin D, diet and exercise    F/U  Return in about 2 years (around 01/01/2025) for annual.   Tracy Gerken B. Nick Stults, PA-C 01/02/2023 2:19 PM

## 2023-01-02 ENCOUNTER — Encounter: Payer: Self-pay | Admitting: Obstetrics and Gynecology

## 2023-01-02 ENCOUNTER — Ambulatory Visit (INDEPENDENT_AMBULATORY_CARE_PROVIDER_SITE_OTHER): Payer: Medicare Other | Admitting: Obstetrics and Gynecology

## 2023-01-02 VITALS — BP 133/70 | HR 76 | Ht 64.0 in | Wt 146.0 lb

## 2023-01-02 DIAGNOSIS — Z1231 Encounter for screening mammogram for malignant neoplasm of breast: Secondary | ICD-10-CM

## 2023-01-02 DIAGNOSIS — E2839 Other primary ovarian failure: Secondary | ICD-10-CM

## 2023-01-02 DIAGNOSIS — M85859 Other specified disorders of bone density and structure, unspecified thigh: Secondary | ICD-10-CM

## 2023-01-02 DIAGNOSIS — Z13820 Encounter for screening for osteoporosis: Secondary | ICD-10-CM

## 2023-01-02 DIAGNOSIS — Z01419 Encounter for gynecological examination (general) (routine) without abnormal findings: Secondary | ICD-10-CM | POA: Diagnosis not present

## 2023-01-02 DIAGNOSIS — Z803 Family history of malignant neoplasm of breast: Secondary | ICD-10-CM

## 2023-01-02 DIAGNOSIS — Z1211 Encounter for screening for malignant neoplasm of colon: Secondary | ICD-10-CM

## 2023-01-02 NOTE — Patient Instructions (Addendum)
I value your feedback and you entrusting us with your care. If you get a [facility] patient survey, I would appreciate you taking the time to let us know about your experience today. Thank you!  [facility] ([place])--[phone]  

## 2023-01-12 ENCOUNTER — Other Ambulatory Visit: Payer: Medicare Other

## 2023-04-26 ENCOUNTER — Ambulatory Visit
Admission: RE | Admit: 2023-04-26 | Discharge: 2023-04-26 | Disposition: A | Discharge status: Home / Self Care | Payer: Medicare Other | Source: Ambulatory Visit | Attending: Obstetrics and Gynecology | Admitting: Obstetrics and Gynecology

## 2023-04-26 DIAGNOSIS — Z13820 Encounter for screening for osteoporosis: Secondary | ICD-10-CM | POA: Insufficient documentation

## 2023-04-26 DIAGNOSIS — E2839 Other primary ovarian failure: Secondary | ICD-10-CM | POA: Insufficient documentation

## 2023-04-26 DIAGNOSIS — M85859 Other specified disorders of bone density and structure, unspecified thigh: Secondary | ICD-10-CM | POA: Diagnosis not present

## 2023-04-26 DIAGNOSIS — Z780 Asymptomatic menopausal state: Secondary | ICD-10-CM | POA: Diagnosis not present

## 2023-04-26 DIAGNOSIS — M85852 Other specified disorders of bone density and structure, left thigh: Secondary | ICD-10-CM | POA: Diagnosis not present

## 2023-04-27 ENCOUNTER — Encounter: Payer: Self-pay | Admitting: Obstetrics and Gynecology

## 2023-05-15 DIAGNOSIS — E063 Autoimmune thyroiditis: Secondary | ICD-10-CM | POA: Diagnosis not present

## 2023-05-22 DIAGNOSIS — E063 Autoimmune thyroiditis: Secondary | ICD-10-CM | POA: Diagnosis not present

## 2023-09-13 ENCOUNTER — Other Ambulatory Visit: Payer: Self-pay | Admitting: Family

## 2023-09-13 DIAGNOSIS — Z0000 Encounter for general adult medical examination without abnormal findings: Secondary | ICD-10-CM

## 2023-09-13 DIAGNOSIS — I10 Essential (primary) hypertension: Secondary | ICD-10-CM

## 2023-10-10 ENCOUNTER — Other Ambulatory Visit: Payer: Self-pay | Admitting: Obstetrics and Gynecology

## 2023-10-10 DIAGNOSIS — Z1231 Encounter for screening mammogram for malignant neoplasm of breast: Secondary | ICD-10-CM

## 2023-10-10 DIAGNOSIS — R921 Mammographic calcification found on diagnostic imaging of breast: Secondary | ICD-10-CM

## 2023-10-13 ENCOUNTER — Emergency Department
Admission: EM | Admit: 2023-10-13 | Discharge: 2023-10-13 | Disposition: A | Discharge status: Home / Self Care | Attending: Emergency Medicine | Admitting: Emergency Medicine

## 2023-10-13 ENCOUNTER — Other Ambulatory Visit: Payer: Self-pay

## 2023-10-13 DIAGNOSIS — L509 Urticaria, unspecified: Secondary | ICD-10-CM | POA: Diagnosis not present

## 2023-10-13 DIAGNOSIS — T7840XA Allergy, unspecified, initial encounter: Secondary | ICD-10-CM | POA: Diagnosis present

## 2023-10-13 DIAGNOSIS — T782XXA Anaphylactic shock, unspecified, initial encounter: Secondary | ICD-10-CM | POA: Diagnosis not present

## 2023-10-13 DIAGNOSIS — I10 Essential (primary) hypertension: Secondary | ICD-10-CM | POA: Diagnosis not present

## 2023-10-13 MED ORDER — DEXAMETHASONE SODIUM PHOSPHATE 10 MG/ML IJ SOLN
10.0000 mg | Freq: Once | INTRAMUSCULAR | Status: AC
  Administered 2023-10-13: 10 mg via INTRAVENOUS

## 2023-10-13 MED ORDER — DEXAMETHASONE SODIUM PHOSPHATE 10 MG/ML IJ SOLN
10.0000 mg | Freq: Once | INTRAMUSCULAR | Status: DC
  Filled 2023-10-13: qty 1

## 2023-10-13 MED ORDER — FAMOTIDINE IN NACL 20-0.9 MG/50ML-% IV SOLN
20.0000 mg | Freq: Once | INTRAVENOUS | Status: AC
  Administered 2023-10-13: 20 mg via INTRAVENOUS
  Filled 2023-10-13: qty 50

## 2023-10-13 MED ORDER — PREDNISONE 10 MG PO TABS
ORAL_TABLET | ORAL | 0 refills | Status: AC
Start: 2023-10-13 — End: ?

## 2023-10-13 MED ORDER — FAMOTIDINE 20 MG PO TABS: 20.0000 mg | ORAL_TABLET | Freq: Two times a day (BID) | ORAL | 1 refills | Status: DC

## 2023-10-13 NOTE — Discharge Instructions (Signed)
 Follow-up with your primary care provider if any continued problems or concerns.  A prescription for prednisone  and Pepcid  was sent to the pharmacy for you to begin taking.  Be aware that the prednisone  could cause some changes with your appetite, interfere with sleep, make you feel hyper which are all temporary side effects of the prednisone .  Return to the emergency department immediately if you develop any worsening of your symptoms or urgent concerns over the weekend.

## 2023-10-13 NOTE — ED Provider Notes (Signed)
 [facility] Provider Note    Event Date/Time   First MD Initiated Contact with Patient 10/13/23 1130     (approximate)   History   Allergic Reaction   HPI  Colleen Stephenson is a 68 y.o. female   presents to the ED via EMS with an allergic reaction to bites of an unknown insect.  Patient was walking her dog at the time and is not aware of what bit her on her foot.  Patient developed hives on her extremities and torso.  She denies any difficulty breathing at this time.  EMS reports that she was given epinephrine, Benadryl 25 mg p.o. and route to the ED.  She has a history of hypertension, liver cyst, low back pain, insomnia.      Physical Exam   Triage Vital Signs: ED Triage Vitals  Encounter Vitals Group     BP      Girls Systolic BP Percentile      Girls Diastolic BP Percentile      Boys Systolic BP Percentile      Boys Diastolic BP Percentile      Pulse      Resp      Temp      Temp src      SpO2      Weight      Height      Head Circumference      Peak Flow      Pain Score      Pain Loc      Pain Education      Exclude from Growth Chart     Most recent vital signs: Vitals:   10/13/23 1400 10/13/23 1405  BP:  127/68  Pulse: 74 78  Resp:  16  Temp:    SpO2: 97% 99%     General: Awake, no distress.  Alert, talkative, normal speech, able to answer questions in complete sentences without any noticeable shortness of breath. CV:  Good peripheral perfusion.  Heart regular rate and rhythm. Resp:  Normal effort.  Lungs are clear bilaterally. Abd:  No distention.  Other:  No swelling oropharyngeal area.  Patient is able to maintain saliva without any difficulty.  Uvula is midline.  Patient does have some erythematous rash to the right anterior neck area, face, trunk, lower extremity especially the foot and upper left thigh area.   ED Results / Procedures / Treatments   Labs (all labs ordered are listed, but only abnormal results are  displayed) Labs Reviewed - No data to display     PROCEDURES:  Critical Care performed:   Procedures   MEDICATIONS ORDERED IN ED: Medications  famotidine  (PEPCID ) IVPB 20 mg premix (0 mg Intravenous Stopped 10/13/23 1222)  dexamethasone  (DECADRON ) injection 10 mg (10 mg Intravenous Given 10/13/23 1148)     IMPRESSION / MDM / ASSESSMENT AND PLAN / ED COURSE  I reviewed the triage vital signs and the nursing notes.   Differential diagnosis includes, but is not limited to, allergic reaction to insect bite, anaphylaxis, urticaria, systemic reaction.SABRA  ----------------------------------------- 12:32 PM on 10/13/2023 ----------------------------------------- Second exam after being given IV medication and patient continues to do well with no difficulty breathing and is feeling much improved.  No wheezing on auscultation and patient still has normal speech, rash is decreasing.  68 year old female presents to the ED after she began having allergic symptoms from an insect that bit her while she was walking her dog.  She was given  epinephrine and Benadryl while and route via EMS but continued to have rash and symptoms while in the ED.  She was given Decadron  10 mg IV along with Pepcid  20 mg IV and symptoms dissipated and did not return.  Patient was feeling tremendously better prior to discharge.  We discussed continuing the prednisone  for the next 5 days and also Pepcid  and a prescription was sent to her pharmacy.  She is aware that she should return to the emergency department if she develops any worsening of her symptoms or urgent concerns over the weekend for reevaluation.  Last exam prior to discharge patient had no further itching and 90% of her rash was completely gone.    Patient's presentation is most consistent with acute presentation with potential threat to life or bodily function.  FINAL CLINICAL IMPRESSION(S) / ED DIAGNOSES   Final diagnoses:  Allergic reaction, initial  encounter     Rx / DC Orders   ED Discharge Orders          Ordered    predniSONE  (DELTASONE ) 10 MG tablet        10/13/23 1343    famotidine  (PEPCID ) 20 MG tablet  2 times daily        10/13/23 1343             Note:  This document was prepared using Dragon voice recognition software and may include unintentional dictation errors.   Saunders Shona CROME, PA-C 10/13/23 1453    Clarine Ozell LABOR, MD 10/13/23 2010

## 2023-10-13 NOTE — ED Triage Notes (Addendum)
 Per EMS report, patient was brought from home after an allergic reactions to bites from an unknown insect while walking her dog. Patient had hives on all extremities, torso and redness and swelling on face. Patient was given an Epipen, and 25mg  Benadryl PO. Patient had taken 25mg  Benadryl PO at home. LSCTA, no swelling of her tongue or difficulty speaking reported and no GI upset. EMT states heart rate was 120bpm prior to Epipen.

## 2023-10-13 NOTE — ED Notes (Signed)
 Patient ambulated to and from hallway bathroom. Hives have mostly resolved. Patient states she feels better.

## 2023-11-13 ENCOUNTER — Encounter: Payer: Self-pay | Admitting: Family

## 2023-11-13 ENCOUNTER — Ambulatory Visit (INDEPENDENT_AMBULATORY_CARE_PROVIDER_SITE_OTHER): Payer: Medicare Other | Admitting: Family

## 2023-11-13 ENCOUNTER — Ambulatory Visit: Payer: Self-pay | Admitting: Family

## 2023-11-13 VITALS — BP 140/80 | HR 80 | Temp 98.0°F | Ht 64.0 in | Wt 146.0 lb

## 2023-11-13 DIAGNOSIS — E559 Vitamin D deficiency, unspecified: Secondary | ICD-10-CM

## 2023-11-13 DIAGNOSIS — M8580 Other specified disorders of bone density and structure, unspecified site: Secondary | ICD-10-CM

## 2023-11-13 DIAGNOSIS — Z780 Asymptomatic menopausal state: Secondary | ICD-10-CM

## 2023-11-13 DIAGNOSIS — I10 Essential (primary) hypertension: Secondary | ICD-10-CM

## 2023-11-13 DIAGNOSIS — Z0000 Encounter for general adult medical examination without abnormal findings: Secondary | ICD-10-CM

## 2023-11-13 DIAGNOSIS — E785 Hyperlipidemia, unspecified: Secondary | ICD-10-CM | POA: Diagnosis not present

## 2023-11-13 LAB — COMPREHENSIVE METABOLIC PANEL WITH GFR
ALT: 12 U/L (ref 0–35)
AST: 17 U/L (ref 0–37)
Albumin: 4.7 g/dL (ref 3.5–5.2)
Alkaline Phosphatase: 81 U/L (ref 39–117)
BUN: 16 mg/dL (ref 6–23)
CO2: 30 meq/L (ref 19–32)
Calcium: 9.8 mg/dL (ref 8.4–10.5)
Chloride: 101 meq/L (ref 96–112)
Creatinine, Ser: 0.97 mg/dL (ref 0.40–1.20)
GFR: 60.14 mL/min (ref >60.00)
Glucose, Bld: 94 mg/dL (ref 70–99)
Potassium: 4.2 meq/L (ref 3.5–5.1)
Sodium: 140 meq/L (ref 135–145)
Total Bilirubin: 1 mg/dL (ref 0.2–1.2)
Total Protein: 7.3 g/dL (ref 6.0–8.3)

## 2023-11-13 LAB — LIPID PANEL
Cholesterol: 278 mg/dL — ABNORMAL HIGH (ref 0–200)
HDL: 82 mg/dL (ref >39.00)
LDL Cholesterol: 178 mg/dL — ABNORMAL HIGH (ref 0–99)
NonHDL: 196.38
Total CHOL/HDL Ratio: 3
Triglycerides: 93 mg/dL (ref 0.0–149.0)
VLDL: 18.6 mg/dL (ref 0.0–40.0)

## 2023-11-13 LAB — CBC WITH DIFFERENTIAL/PLATELET
Basophils Absolute: 0 K/uL (ref 0.0–0.1)
Basophils Relative: 0.9 % (ref 0.0–3.0)
Eosinophils Absolute: 0.1 K/uL (ref 0.0–0.7)
Eosinophils Relative: 2.9 % (ref 0.0–5.0)
HCT: 42.1 % (ref 36.0–46.0)
Hemoglobin: 14 g/dL (ref 12.0–15.0)
Lymphocytes Relative: 26.7 % (ref 12.0–46.0)
Lymphs Abs: 1.1 K/uL (ref 0.7–4.0)
MCHC: 33.3 g/dL (ref 30.0–36.0)
MCV: 93.2 fl (ref 78.0–100.0)
Monocytes Absolute: 0.4 K/uL (ref 0.1–1.0)
Monocytes Relative: 9.5 % (ref 3.0–12.0)
Neutro Abs: 2.4 K/uL (ref 1.4–7.7)
Neutrophils Relative %: 60 % (ref 43.0–77.0)
Platelets: 233 K/uL (ref 150.0–400.0)
RBC: 4.52 Mil/uL (ref 3.87–5.11)
RDW: 13.2 % (ref 11.5–15.5)
WBC: 4 K/uL (ref 4.0–10.5)

## 2023-11-13 LAB — VITAMIN D 25 HYDROXY (VIT D DEFICIENCY, FRACTURES): VITD: 29.62 ng/mL — ABNORMAL LOW (ref 30.00–100.00)

## 2023-11-13 NOTE — Patient Instructions (Signed)
 It is imperative that you are seen AT least twice per year for labs and monitoring. Monitor blood pressure at home and me 5-6 reading on separate days. Goal is less than 120/80, based on newest guidelines, however we certainly want to be less than 130/80;  if persistently higher, please make sooner follow up appointment so we can recheck you blood pressure and manage/ adjust medications.  Managing Your Hypertension Hypertension, also called high blood pressure, is when the force of the blood pressing against the walls of the arteries is too strong. Arteries are blood vessels that carry blood from your heart throughout your body. Hypertension forces the heart to work harder to pump blood and may cause the arteries to become narrow or stiff. Understanding blood pressure readings A blood pressure reading includes a higher number over a lower number: The first, or top, number is called the systolic pressure. It is a measure of the pressure in your arteries as your heart beats. The second, or bottom number, is called the diastolic pressure. It is a measure of the pressure in your arteries as the heart relaxes. For most people, a normal blood pressure is below 120/80. Your personal target blood pressure may vary depending on your medical conditions, your age, and other factors. Blood pressure is classified into four stages. Based on your blood pressure reading, your health care provider may use the following stages to determine what type of treatment you need, if any. Systolic pressure and diastolic pressure are measured in a unit called millimeters of mercury (mmHg). Normal Systolic pressure: below 120. Diastolic pressure: below 80. Elevated Systolic pressure: 120-129. Diastolic pressure: below 80. Hypertension stage 1 Systolic pressure: 130-139. Diastolic pressure: 80-89. Hypertension stage 2 Systolic pressure: 140 or above. Diastolic pressure: 90 or above. How can this condition affect  me? Managing your hypertension is very important. Over time, hypertension can damage the arteries and decrease blood flow to parts of the body, including the brain, heart, and kidneys. Having untreated or uncontrolled hypertension can lead to: A heart attack. A stroke. A weakened blood vessel (aneurysm). Heart failure. Kidney damage. Eye damage. Memory and concentration problems. Vascular dementia. What actions can I take to manage this condition? Hypertension can be managed by making lifestyle changes and possibly by taking medicines. Your health care provider will help you make a plan to bring your blood pressure within a normal range. You may be referred for counseling on a healthy diet and physical activity. Nutrition  Eat a diet that is high in fiber and potassium, and low in salt (sodium), added sugar, and fat. An example eating plan is called the DASH diet. DASH stands for Dietary Approaches to Stop Hypertension. To eat this way: Eat plenty of fresh fruits and vegetables. Try to fill one-half of your plate at each meal with fruits and vegetables. Eat whole grains, such as whole-wheat pasta, brown rice, or whole-grain bread. Fill about one-fourth of your plate with whole grains. Eat low-fat dairy products. Avoid fatty cuts of meat, processed or cured meats, and poultry with skin. Fill about one-fourth of your plate with lean proteins such as fish, chicken without skin, beans, eggs, and tofu. Avoid pre-made and processed foods. These tend to be higher in sodium, added sugar, and fat. Reduce your daily sodium intake. Many people with hypertension should eat less than 1,500 mg of sodium a day. Lifestyle  Work with your health care provider to maintain a healthy body weight or to lose weight. Ask what an ideal  weight is for you. Get at least 30 minutes of exercise that causes your heart to beat faster (aerobic exercise) most days of the week. Activities may include walking, swimming, or  biking. Include exercise to strengthen your muscles (resistance exercise), such as weight lifting, as part of your weekly exercise routine. Try to do these types of exercises for 30 minutes at least 3 days a week. Do not use any products that contain nicotine or tobacco. These products include cigarettes, chewing tobacco, and vaping devices, such as e-cigarettes. If you need help quitting, ask your health care provider. Control any long-term (chronic) conditions you have, such as high cholesterol or diabetes. Identify your sources of stress and find ways to manage stress. This may include meditation, deep breathing, or making time for fun activities. Alcohol use Do not drink alcohol if: Your health care provider tells you not to drink. You are pregnant, may be pregnant, or are planning to become pregnant. If you drink alcohol: Limit how much you have to: 0-1 drink a day for women. 0-2 drinks a day for men. Know how much alcohol is in your drink. In the [place] one drink equals one 12 oz bottle of beer (355 mL), one 5 oz glass of wine (148 mL), or one 1 oz glass of hard liquor (44 mL). Medicines Your health care provider may prescribe medicine if lifestyle changes are not enough to get your blood pressure under control and if: Your systolic blood pressure is 130 or higher. Your diastolic blood pressure is 80 or higher. Take medicines only as told by your health care provider. Follow the directions carefully. Blood pressure medicines must be taken as told by your health care provider. The medicine does not work as well when you skip doses. Skipping doses also puts you at risk for problems. Monitoring Before you monitor your blood pressure: Do not smoke, drink caffeinated beverages, or exercise within 30 minutes before taking a measurement. Use the bathroom and empty your bladder (urinate). Sit quietly for at least 5 minutes before taking measurements. Monitor your blood pressure at home as told  by your health care provider. To do this: Sit with your back straight and supported. Place your feet flat on the floor. Do not cross your legs. Support your arm on a flat surface, such as a table. Make sure your upper arm is at heart level. Each time you measure, take two or three readings one minute apart and record the results. You may also need to have your blood pressure checked regularly by your health care provider. General information Talk with your health care provider about your diet, exercise habits, and other lifestyle factors that may be contributing to hypertension. Review all the medicines you take with your health care provider because there may be side effects or interactions. Keep all follow-up visits. Your health care provider can help you create and adjust your plan for managing your high blood pressure. Where to find more information National Heart, Lung, and Blood Institute: [employer]is American Heart Association: www.heart.org Contact a health care provider if: You think you are having a reaction to medicines you have taken. You have repeated (recurrent) headaches. You feel dizzy. You have swelling in your ankles. You have trouble with your vision. Get help right away if: You develop a severe headache or confusion. You have unusual weakness or numbness, or you feel faint. You have severe pain in your chest or abdomen. You vomit repeatedly. You have trouble breathing. These symptoms may be  an emergency. Get help right away. Call 911. Do not wait to see if the symptoms will go away. Do not drive yourself to the hospital. Summary Hypertension is when the force of blood pumping through your arteries is too strong. If this condition is not controlled, it may put you at risk for serious complications. Your personal target blood pressure may vary depending on your medical conditions, your age, and other factors. For most people, a normal blood pressure is less than  120/80. Hypertension is managed by lifestyle changes, medicines, or both. Lifestyle changes to help manage hypertension include losing weight, eating a healthy, low-sodium diet, exercising more, stopping smoking, and limiting alcohol. This information is not intended to replace advice given to you by your health care provider. Make sure you discuss any questions you have with your health care provider. Document Revised: 09/24/2020 Document Reviewed: 09/24/2020 Elsevier Patient Education  2024 [employer].Health Maintenance for Postmenopausal Women Menopause is a normal process in which your ability to get pregnant comes to an end. This process happens slowly over many months or years, usually between the ages of 23 and 35. Menopause is complete when you have missed your menstrual period for 12 months. It is important to talk with your health care provider about some of the most common conditions that affect women after menopause (postmenopausal women). These include heart disease, cancer, and bone loss (osteoporosis). Adopting a healthy lifestyle and getting preventive care can help to promote your health and wellness. The actions you take can also lower your chances of developing some of these common conditions. What are the signs and symptoms of menopause? During menopause, you may have the following symptoms: Hot flashes. These can be moderate or severe. Night sweats. Decrease in sex drive. Mood swings. Headaches. Tiredness (fatigue). Irritability. Memory problems. Problems falling asleep or staying asleep. Talk with your health care provider about treatment options for your symptoms. Do I need hormone replacement therapy? Hormone replacement therapy is effective in treating symptoms that are caused by menopause, such as hot flashes and night sweats. Hormone replacement carries certain risks, especially as you become older. If you are thinking about using estrogen or estrogen with  progestin, discuss the benefits and risks with your health care provider. How can I reduce my risk for heart disease and stroke? The risk of heart disease, heart attack, and stroke increases as you age. One of the causes may be a change in the body's hormones during menopause. This can affect how your body uses dietary fats, triglycerides, and cholesterol. Heart attack and stroke are medical emergencies. There are many things that you can do to help prevent heart disease and stroke. Watch your blood pressure High blood pressure causes heart disease and increases the risk of stroke. This is more likely to develop in people who have high blood pressure readings or are overweight. Have your blood pressure checked: Every 3-5 years if you are 86-74 years of age. Every year if you are 21 years old or older. Eat a healthy diet  Eat a diet that includes plenty of vegetables, fruits, low-fat dairy products, and lean protein. Do not eat a lot of foods that are high in solid fats, added sugars, or sodium. Get regular exercise Get regular exercise. This is one of the most important things you can do for your health. Most adults should: Try to exercise for at least 150 minutes each week. The exercise should increase your heart rate and make you sweat (moderate-intensity exercise). Try  to do strengthening exercises at least twice each week. Do these in addition to the moderate-intensity exercise. Spend less time sitting. Even light physical activity can be beneficial. Other tips Work with your health care provider to achieve or maintain a healthy weight. Do not use any products that contain nicotine or tobacco. These products include cigarettes, chewing tobacco, and vaping devices, such as e-cigarettes. If you need help quitting, ask your health care provider. Know your numbers. Ask your health care provider to check your cholesterol and your blood sugar (glucose). Continue to have your blood tested as  directed by your health care provider. Do I need screening for cancer? Depending on your health history and family history, you may need to have cancer screenings at different stages of your life. This may include screening for: Breast cancer. Cervical cancer. Lung cancer. Colorectal cancer. What is my risk for osteoporosis? After menopause, you may be at increased risk for osteoporosis. Osteoporosis is a condition in which bone destruction happens more quickly than new bone creation. To help prevent osteoporosis or the bone fractures that can happen because of osteoporosis, you may take the following actions: If you are 56-67 years old, get at least 1,000 mg of calcium  and at least 600 international units (IU) of vitamin D  per day. If you are older than age 1 but younger than age 50, get at least 1,200 mg of calcium  and at least 600 international units (IU) of vitamin D  per day. If you are older than age 10, get at least 1,200 mg of calcium  and at least 800 international units (IU) of vitamin D  per day. Smoking and drinking excessive alcohol increase the risk of osteoporosis. Eat foods that are rich in calcium  and vitamin D , and do weight-bearing exercises several times each week as directed by your health care provider. How does menopause affect my mental health? Depression may occur at any age, but it is more common as you become older. Common symptoms of depression include: Feeling depressed. Changes in sleep patterns. Changes in appetite or eating patterns. Feeling an overall lack of motivation or enjoyment of activities that you previously enjoyed. Frequent crying spells. Talk with your health care provider if you think that you are experiencing any of these symptoms. General instructions See your health care provider for regular wellness exams and vaccines. This may include: Scheduling regular health, dental, and eye exams. Getting and maintaining your vaccines. These  include: Influenza vaccine. Get this vaccine each year before the flu season begins. Pneumonia vaccine. Shingles vaccine. Tetanus, diphtheria, and pertussis (Tdap) booster vaccine. Your health care provider may also recommend other immunizations. Tell your health care provider if you have ever been abused or do not feel safe at home. Summary Menopause is a normal process in which your ability to get pregnant comes to an end. This condition causes hot flashes, night sweats, decreased interest in sex, mood swings, headaches, or lack of sleep. Treatment for this condition may include hormone replacement therapy. Take actions to keep yourself healthy, including exercising regularly, eating a healthy diet, watching your weight, and checking your blood pressure and blood sugar levels. Get screened for cancer and depression. Make sure that you are up to date with all your vaccines. This information is not intended to replace advice given to you by your health care provider. Make sure you discuss any questions you have with your health care provider. Document Revised: 06/01/2020 Document Reviewed: 06/01/2020 Elsevier Patient Education  2024 [employer].

## 2023-11-13 NOTE — Assessment & Plan Note (Signed)
 Elevated today. BP at endocrine with good control. We agreed to monitor at home. Goal < 130/80. She will call me with escalations. Continue amlodipine  2.5mg  QD

## 2023-11-13 NOTE — Assessment & Plan Note (Signed)
 Deferred CBE and pelvic exam due to patient preference. She may continue to follow with GYN.  Mammogram scheduled.

## 2023-11-13 NOTE — Progress Notes (Signed)
 Assessment & Plan:  Annual physical exam Assessment & Plan: Deferred CBE and pelvic exam due to patient preference. She may continue to follow with GYN.  Mammogram scheduled.   Orders: -     Lipid panel  Hyperlipidemia, unspecified hyperlipidemia type -     Lipid panel -     Comprehensive metabolic panel with GFR -     CBC with Differential/Platelet  Hypertension, unspecified type Assessment & Plan: Elevated today. BP at endocrine with good control. We agreed to monitor at home. Goal < 130/80. She will call me with escalations. Continue amlodipine  2.5mg  QD   Asymptomatic postmenopausal state  Osteopenia, unspecified location -     VITAMIN D  25 Hydroxy (Vit-D Deficiency, Fractures)  Vitamin D  deficiency, unspecified -     VITAMIN D  25 Hydroxy (Vit-D Deficiency, Fractures)     Return precautions given.   Risks, benefits, and alternatives of the medications and treatment plan prescribed today were discussed, and patient expressed understanding.   Education regarding symptom management and diagnosis given to patient on AVS either electronically or printed.  Return for Complete Physical Exam.  Rollene Northern, FNP  Subjective:    Patient ID: Colleen Stephenson, female    DOB: [birth year], 16 y.o.   MRN: [id number]  CC: Colleen Stephenson is a 68 y.o. female who presents today for physical exam.    HPI: Feels well today No new complaints  She is compliant with amlodipine .  She reports a h/o 'white coat hypertension' She doesn't routinely check blood pressure at home however has a BP machine.    Following Dr Damian for hypothyroidism; BP 126/62  Colorectal Cancer Screening:  UTD, Dr Onita, 04/2022; due in 7-10 years.   Breast Cancer Screening: BL Diag Mammogram scheduled Cervical Cancer Screening:  h/o hysterectomy;Last Pap: 02/02/17 Results were: no abnormalities /neg HPV DNA.   Bone Health screening/DEXA for 65+: DEXA 4/2 /25  Lung Cancer Screening: Doesn't have 20  year pack year history and age > 25 years yo 33 years       Tetanus - due        Pneumococcal - Candidate for; declines  Exercise: Gets regular exercise walking and hiking.   Alcohol use:  couple times of week wine or liquor.  Smoking/tobacco use: Nonsmoker.    Health Maintenance  Topic Date Due   DTaP/Tdap/Td vaccine (1 - Tdap) Never done   Medicare Annual Wellness Visit  03/25/2023   COVID-19 Vaccine (3 - 2025-26 season) 09/25/2023   Zoster (Shingles) Vaccine (1 of 2) 02/13/2024*   Flu Shot  04/23/2024*   Pneumococcal Vaccine for age over 64 (1 of 1 - PCV) 11/12/2024*   Breast Cancer Screening  11/22/2024   Colon Cancer Screening  05/15/2029   DEXA scan (bone density measurement)  Completed   Meningitis B Vaccine  Aged Out   Hepatitis C Screening  Discontinued  *Topic was postponed. The date shown is not the original due date.    ALLERGIES: Amoxicillin-pot clavulanate and Crestor  [rosuvastatin ]  Current Outpatient Medications on File Prior to Visit  Medication Sig Dispense Refill   amLODipine  (NORVASC ) 2.5 MG tablet TAKE 1 TABLET(2.5 MG) BY MOUTH DAILY 90 tablet 3   Calcium  Carb-Cholecalciferol (CALCIUM  600+D) 600-800 MG-UNIT TABS Take 1 tablet by mouth daily.     levothyroxine (SYNTHROID) 50 MCG tablet Take by mouth.     No current facility-administered medications on file prior to visit.    Review of Systems  Constitutional:  Negative for chills  and fever.  Respiratory:  Negative for cough.   Cardiovascular:  Negative for chest pain and palpitations.  Gastrointestinal:  Negative for nausea and vomiting.  Genitourinary:  Negative for dyspareunia and pelvic pain.      Objective:    BP (!) 140/80 Comment: right arm  Pulse 80   Temp 98 F (36.7 C) (Oral)   Ht 5' 4 (1.626 m)   Wt 146 lb (66.2 kg)   SpO2 95%   BMI 25.06 kg/m   BP Readings from Last 3 Encounters:  11/13/23 (!) 140/80  10/13/23 127/68  01/02/23 133/70   Wt Readings from Last 3 Encounters:   11/13/23 146 lb (66.2 kg)  10/13/23 138 lb (62.6 kg)  01/02/23 146 lb (66.2 kg)    Physical Exam Vitals reviewed.  Constitutional:      Appearance: Normal appearance. She is well-developed.  Eyes:     Conjunctiva/sclera: Conjunctivae normal.  Neck:     Thyroid : No thyroid  mass or thyromegaly.  Cardiovascular:     Rate and Rhythm: Normal rate and regular rhythm.     Pulses: Normal pulses.     Heart sounds: Normal heart sounds.  Pulmonary:     Effort: Pulmonary effort is normal.     Breath sounds: Normal breath sounds. No wheezing, rhonchi or rales.  Chest:  Breasts:    Breasts are symmetrical.     Right: No inverted nipple, mass, nipple discharge, skin change or tenderness.     Left: No inverted nipple, mass, nipple discharge, skin change or tenderness.  Abdominal:     General: Bowel sounds are normal. There is no distension.     Palpations: Abdomen is soft. Abdomen is not rigid. There is no fluid wave or mass.     Tenderness: There is no abdominal tenderness. There is no guarding or rebound.  Lymphadenopathy:     Head:     Right side of head: No submental, submandibular, tonsillar, preauricular, posterior auricular or occipital adenopathy.     Left side of head: No submental, submandibular, tonsillar, preauricular, posterior auricular or occipital adenopathy.     Cervical: No cervical adenopathy.     Right cervical: No superficial, deep or posterior cervical adenopathy.    Left cervical: No superficial, deep or posterior cervical adenopathy.  Skin:    General: Skin is warm and dry.  Neurological:     Mental Status: She is alert.  Psychiatric:        Speech: Speech normal.        Behavior: Behavior normal.        Thought Content: Thought content normal.

## 2023-12-05 ENCOUNTER — Ambulatory Visit
Admission: RE | Admit: 2023-12-05 | Discharge: 2023-12-05 | Disposition: A | Discharge status: Home / Self Care | Source: Ambulatory Visit | Attending: Obstetrics and Gynecology | Admitting: Obstetrics and Gynecology

## 2023-12-05 ENCOUNTER — Ambulatory Visit: Payer: Self-pay | Admitting: Obstetrics and Gynecology

## 2023-12-05 DIAGNOSIS — Z1231 Encounter for screening mammogram for malignant neoplasm of breast: Secondary | ICD-10-CM | POA: Diagnosis not present

## 2023-12-05 DIAGNOSIS — R921 Mammographic calcification found on diagnostic imaging of breast: Secondary | ICD-10-CM | POA: Insufficient documentation

## 2023-12-13 DIAGNOSIS — L2389 Allergic contact dermatitis due to other agents: Secondary | ICD-10-CM | POA: Diagnosis not present

## 2023-12-13 DIAGNOSIS — Z86018 Personal history of other benign neoplasm: Secondary | ICD-10-CM | POA: Diagnosis not present

## 2023-12-13 DIAGNOSIS — Z872 Personal history of diseases of the skin and subcutaneous tissue: Secondary | ICD-10-CM | POA: Diagnosis not present

## 2023-12-13 DIAGNOSIS — L578 Other skin changes due to chronic exposure to nonionizing radiation: Secondary | ICD-10-CM | POA: Diagnosis not present

## 2024-11-19 ENCOUNTER — Ambulatory Visit: Admitting: Family
